# Patient Record
Sex: Female | Born: 1950 | Race: White | Hispanic: Yes | Marital: Married | Smoking: Never smoker
Health system: Southern US, Community
[De-identification: ages and names within clinical notes are randomized; demographics above are authoritative.]

## PROBLEM LIST (undated history)

## (undated) DIAGNOSIS — F329 Major depressive disorder, single episode, unspecified: Secondary | ICD-10-CM

## (undated) DIAGNOSIS — M8580 Other specified disorders of bone density and structure, unspecified site: Secondary | ICD-10-CM

## (undated) DIAGNOSIS — F32A Depression, unspecified: Secondary | ICD-10-CM

## (undated) DIAGNOSIS — M1990 Unspecified osteoarthritis, unspecified site: Secondary | ICD-10-CM

## (undated) DIAGNOSIS — C50919 Malignant neoplasm of unspecified site of unspecified female breast: Secondary | ICD-10-CM

## (undated) DIAGNOSIS — G4700 Insomnia, unspecified: Secondary | ICD-10-CM

## (undated) DIAGNOSIS — Z853 Personal history of malignant neoplasm of breast: Principal | ICD-10-CM

## (undated) DIAGNOSIS — Z1379 Encounter for other screening for genetic and chromosomal anomalies: Principal | ICD-10-CM

## (undated) DIAGNOSIS — E039 Hypothyroidism, unspecified: Secondary | ICD-10-CM

## (undated) DIAGNOSIS — F419 Anxiety disorder, unspecified: Secondary | ICD-10-CM

## (undated) HISTORY — PX: OOPHORECTOMY: SHX86

## (undated) HISTORY — PX: NOSE SURGERY: SHX723

## (undated) HISTORY — DX: Insomnia, unspecified: G47.00

## (undated) HISTORY — DX: Other specified disorders of bone density and structure, unspecified site: M85.80

## (undated) HISTORY — DX: Major depressive disorder, single episode, unspecified: F32.9

## (undated) HISTORY — DX: Encounter for other screening for genetic and chromosomal anomalies: Z13.79

## (undated) HISTORY — DX: Malignant neoplasm of unspecified site of unspecified female breast: C50.919

## (undated) HISTORY — DX: Depression, unspecified: F32.A

## (undated) HISTORY — PX: EXTERNAL EAR SURGERY: SHX627

## (undated) HISTORY — DX: Hypothyroidism, unspecified: E03.9

## (undated) HISTORY — DX: Unspecified osteoarthritis, unspecified site: M19.90

## (undated) HISTORY — DX: Personal history of malignant neoplasm of breast: Z85.3

## (undated) HISTORY — DX: Anxiety disorder, unspecified: F41.9

---

## 1994-09-06 DIAGNOSIS — C50919 Malignant neoplasm of unspecified site of unspecified female breast: Secondary | ICD-10-CM

## 1994-09-06 DIAGNOSIS — Z853 Personal history of malignant neoplasm of breast: Secondary | ICD-10-CM

## 1994-09-06 HISTORY — PX: MASTECTOMY: SHX3

## 1994-09-06 HISTORY — DX: Personal history of malignant neoplasm of breast: Z85.3

## 1994-09-06 HISTORY — PX: BREAST SURGERY: SHX581

## 1994-09-06 HISTORY — DX: Malignant neoplasm of unspecified site of unspecified female breast: C50.919

## 1998-02-12 ENCOUNTER — Other Ambulatory Visit: Admission: RE | Admit: 1998-02-12 | Discharge: 1998-02-12 | Payer: Self-pay | Admitting: Gynecology

## 1998-02-19 ENCOUNTER — Other Ambulatory Visit: Admission: RE | Admit: 1998-02-19 | Discharge: 1998-02-19 | Payer: Self-pay | Admitting: Gynecology

## 1998-04-22 ENCOUNTER — Ambulatory Visit (HOSPITAL_COMMUNITY): Admission: RE | Admit: 1998-04-22 | Discharge: 1998-04-22 | Payer: Self-pay | Admitting: Gastroenterology

## 1998-11-11 ENCOUNTER — Other Ambulatory Visit: Admission: RE | Admit: 1998-11-11 | Discharge: 1998-11-11 | Payer: Self-pay | Admitting: Gynecology

## 1999-02-17 ENCOUNTER — Other Ambulatory Visit: Admission: RE | Admit: 1999-02-17 | Discharge: 1999-02-17 | Payer: Self-pay | Admitting: Gynecology

## 1999-10-26 ENCOUNTER — Encounter: Admission: RE | Admit: 1999-10-26 | Discharge: 1999-10-26 | Payer: Self-pay | Admitting: Oncology

## 1999-10-26 ENCOUNTER — Encounter: Payer: Self-pay | Admitting: Oncology

## 2000-04-18 ENCOUNTER — Other Ambulatory Visit: Admission: RE | Admit: 2000-04-18 | Discharge: 2000-04-18 | Payer: Self-pay | Admitting: Internal Medicine

## 2000-05-11 ENCOUNTER — Encounter: Payer: Self-pay | Admitting: Oncology

## 2000-05-11 ENCOUNTER — Encounter: Admission: RE | Admit: 2000-05-11 | Discharge: 2000-05-11 | Payer: Self-pay | Admitting: Oncology

## 2000-05-12 ENCOUNTER — Encounter: Payer: Self-pay | Admitting: Oncology

## 2000-05-12 ENCOUNTER — Encounter: Admission: RE | Admit: 2000-05-12 | Discharge: 2000-05-12 | Payer: Self-pay | Admitting: Oncology

## 2001-04-20 ENCOUNTER — Other Ambulatory Visit: Admission: RE | Admit: 2001-04-20 | Discharge: 2001-04-20 | Payer: Self-pay | Admitting: Gynecology

## 2001-05-15 ENCOUNTER — Encounter: Payer: Self-pay | Admitting: Oncology

## 2001-05-15 ENCOUNTER — Encounter: Admission: RE | Admit: 2001-05-15 | Discharge: 2001-05-15 | Payer: Self-pay | Admitting: Oncology

## 2002-04-20 ENCOUNTER — Other Ambulatory Visit: Admission: RE | Admit: 2002-04-20 | Discharge: 2002-04-20 | Payer: Self-pay | Admitting: Gynecology

## 2002-05-09 ENCOUNTER — Encounter: Admission: RE | Admit: 2002-05-09 | Discharge: 2002-05-09 | Payer: Self-pay | Admitting: Gynecology

## 2002-05-09 ENCOUNTER — Encounter: Payer: Self-pay | Admitting: Gynecology

## 2002-05-16 ENCOUNTER — Encounter: Admission: RE | Admit: 2002-05-16 | Discharge: 2002-05-16 | Payer: Self-pay | Admitting: Oncology

## 2002-05-16 ENCOUNTER — Encounter: Payer: Self-pay | Admitting: Oncology

## 2003-02-13 ENCOUNTER — Ambulatory Visit (HOSPITAL_BASED_OUTPATIENT_CLINIC_OR_DEPARTMENT_OTHER): Admission: RE | Admit: 2003-02-13 | Discharge: 2003-02-13 | Payer: Self-pay | Admitting: Orthopedic Surgery

## 2003-05-02 ENCOUNTER — Other Ambulatory Visit: Admission: RE | Admit: 2003-05-02 | Discharge: 2003-05-02 | Payer: Self-pay | Admitting: Gynecology

## 2003-05-03 ENCOUNTER — Encounter: Admission: RE | Admit: 2003-05-03 | Discharge: 2003-05-03 | Payer: Self-pay | Admitting: Gynecology

## 2003-05-03 ENCOUNTER — Encounter: Payer: Self-pay | Admitting: Gynecology

## 2003-05-27 ENCOUNTER — Encounter: Payer: Self-pay | Admitting: Gynecology

## 2003-05-27 ENCOUNTER — Ambulatory Visit (HOSPITAL_COMMUNITY): Admission: RE | Admit: 2003-05-27 | Discharge: 2003-05-27 | Payer: Self-pay | Admitting: Gynecology

## 2004-02-12 ENCOUNTER — Encounter: Admission: RE | Admit: 2004-02-12 | Discharge: 2004-02-12 | Payer: Self-pay

## 2004-05-15 ENCOUNTER — Other Ambulatory Visit: Admission: RE | Admit: 2004-05-15 | Discharge: 2004-05-15 | Payer: Self-pay | Admitting: Gynecology

## 2005-01-13 ENCOUNTER — Ambulatory Visit: Payer: Self-pay | Admitting: Internal Medicine

## 2005-01-18 ENCOUNTER — Ambulatory Visit: Payer: Self-pay | Admitting: Internal Medicine

## 2005-02-12 ENCOUNTER — Encounter: Admission: RE | Admit: 2005-02-12 | Discharge: 2005-02-12 | Payer: Self-pay | Admitting: Gynecology

## 2005-04-29 ENCOUNTER — Encounter: Admission: RE | Admit: 2005-04-29 | Discharge: 2005-04-29 | Payer: Self-pay | Admitting: Gynecology

## 2005-05-20 ENCOUNTER — Other Ambulatory Visit: Admission: RE | Admit: 2005-05-20 | Discharge: 2005-05-20 | Payer: Self-pay | Admitting: Gynecology

## 2005-06-10 ENCOUNTER — Ambulatory Visit: Payer: Self-pay | Admitting: Oncology

## 2005-07-07 ENCOUNTER — Ambulatory Visit: Payer: Self-pay | Admitting: Internal Medicine

## 2005-07-12 ENCOUNTER — Ambulatory Visit: Payer: Self-pay | Admitting: Internal Medicine

## 2005-07-19 ENCOUNTER — Emergency Department (HOSPITAL_COMMUNITY): Admission: EM | Admit: 2005-07-19 | Discharge: 2005-07-19 | Payer: Self-pay | Admitting: *Deleted

## 2005-11-09 ENCOUNTER — Other Ambulatory Visit: Admission: RE | Admit: 2005-11-09 | Discharge: 2005-11-09 | Payer: Self-pay | Admitting: Gynecology

## 2005-12-28 ENCOUNTER — Encounter: Admission: RE | Admit: 2005-12-28 | Discharge: 2005-12-28 | Payer: Self-pay | Admitting: Family Medicine

## 2006-01-03 ENCOUNTER — Encounter: Admission: RE | Admit: 2006-01-03 | Discharge: 2006-01-03 | Payer: Self-pay | Admitting: Family Medicine

## 2006-03-07 ENCOUNTER — Ambulatory Visit: Payer: Self-pay | Admitting: Internal Medicine

## 2006-03-10 ENCOUNTER — Ambulatory Visit: Payer: Self-pay | Admitting: Gastroenterology

## 2006-03-28 ENCOUNTER — Encounter: Admission: RE | Admit: 2006-03-28 | Discharge: 2006-03-28 | Payer: Self-pay | Admitting: Gynecology

## 2006-04-22 ENCOUNTER — Ambulatory Visit: Payer: Self-pay | Admitting: Family Medicine

## 2006-04-25 ENCOUNTER — Encounter: Admission: RE | Admit: 2006-04-25 | Discharge: 2006-04-25 | Payer: Self-pay | Admitting: Internal Medicine

## 2006-04-27 ENCOUNTER — Ambulatory Visit: Payer: Self-pay | Admitting: Family Medicine

## 2006-04-28 ENCOUNTER — Encounter: Admission: RE | Admit: 2006-04-28 | Discharge: 2006-04-28 | Payer: Self-pay | Admitting: Internal Medicine

## 2006-05-06 ENCOUNTER — Ambulatory Visit: Payer: Self-pay | Admitting: Family Medicine

## 2006-05-24 ENCOUNTER — Other Ambulatory Visit: Admission: RE | Admit: 2006-05-24 | Discharge: 2006-05-24 | Payer: Self-pay | Admitting: Gynecology

## 2006-09-02 ENCOUNTER — Ambulatory Visit: Payer: Self-pay | Admitting: Internal Medicine

## 2006-11-11 ENCOUNTER — Ambulatory Visit: Payer: Self-pay | Admitting: Family Medicine

## 2006-11-24 ENCOUNTER — Encounter (INDEPENDENT_AMBULATORY_CARE_PROVIDER_SITE_OTHER): Payer: Self-pay | Admitting: Specialist

## 2006-11-24 ENCOUNTER — Ambulatory Visit (HOSPITAL_BASED_OUTPATIENT_CLINIC_OR_DEPARTMENT_OTHER): Admission: RE | Admit: 2006-11-24 | Discharge: 2006-11-24 | Payer: Self-pay | Admitting: Gynecology

## 2006-12-15 ENCOUNTER — Ambulatory Visit: Payer: Self-pay | Admitting: Oncology

## 2006-12-29 ENCOUNTER — Ambulatory Visit (HOSPITAL_COMMUNITY): Admission: RE | Admit: 2006-12-29 | Discharge: 2006-12-29 | Payer: Self-pay | Admitting: Oncology

## 2007-02-20 ENCOUNTER — Ambulatory Visit: Payer: Self-pay | Admitting: Oncology

## 2007-08-18 IMAGING — CT CT CHEST W/ CM
2 of 3 series · 15 of 36 positions shown, 18 images · IV contrast (omnipaque)
Comparison: None

CLINICAL DATA: Breast cancer left axillary and chest wall pain

CHEST CT WITH CONTRAST
TECHNIQUE: Multidetector CT imaging of the chest was performed following the
standard protocol during bolus administration of intravenous contrast.
Contrast:  80 cc Omnipaque 300

[Series 2: chest routine 5.0 b40f · axial · 0.63mm/px · z∈[-256,+34]mm · 12 of 68 slices shown, 15 images]
[im 5/68  mediastinal]
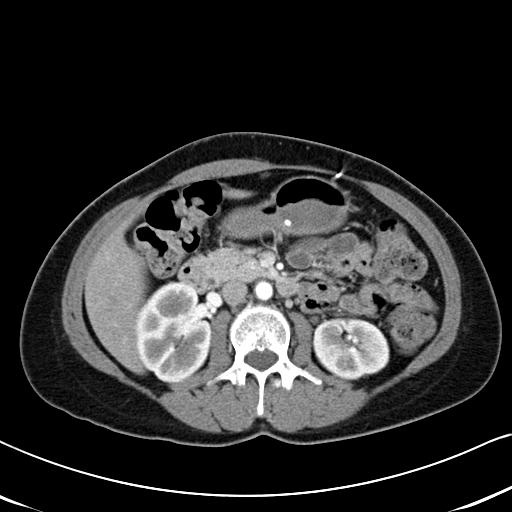
[im 5/68  lung]
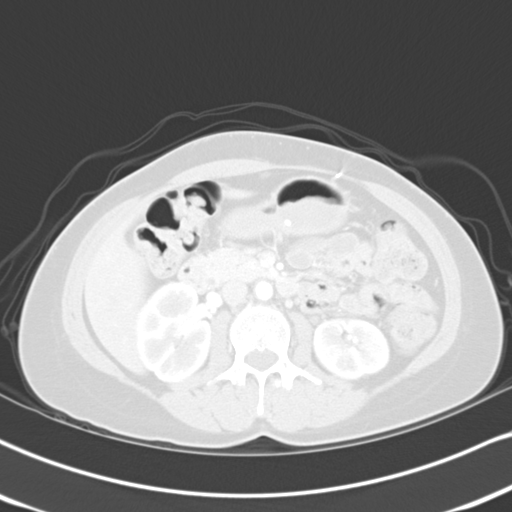
[im 10/68  lung]
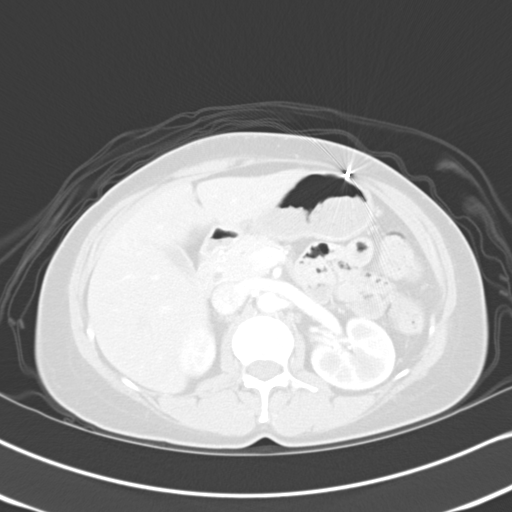
[im 15/68  lung]
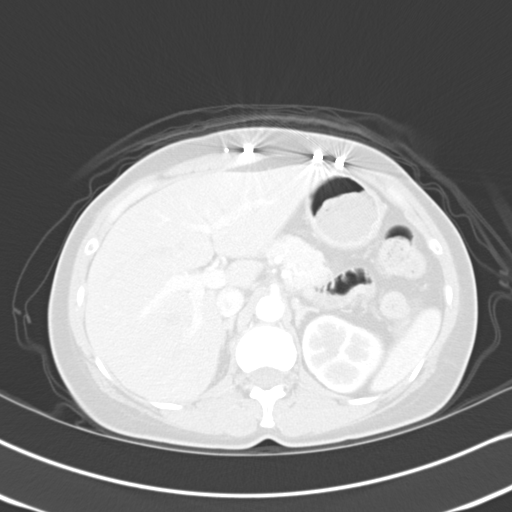
[im 20/68  lung]
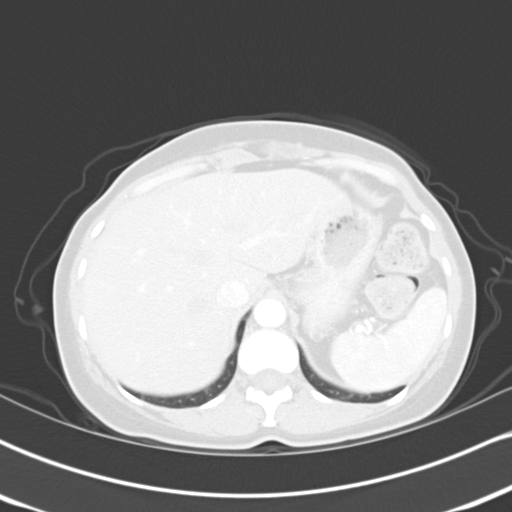
[im 25/68  mediastinal]
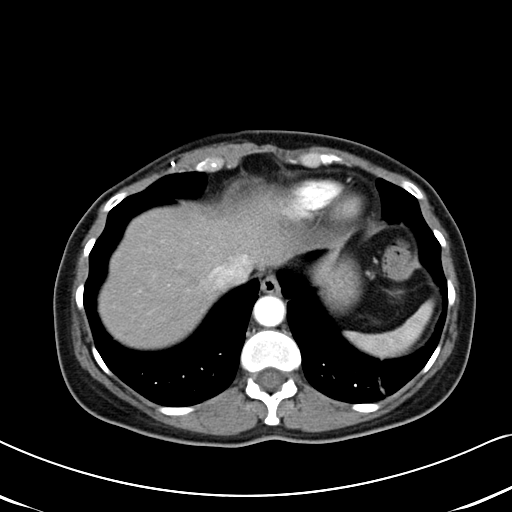
[im 25/68  lung]
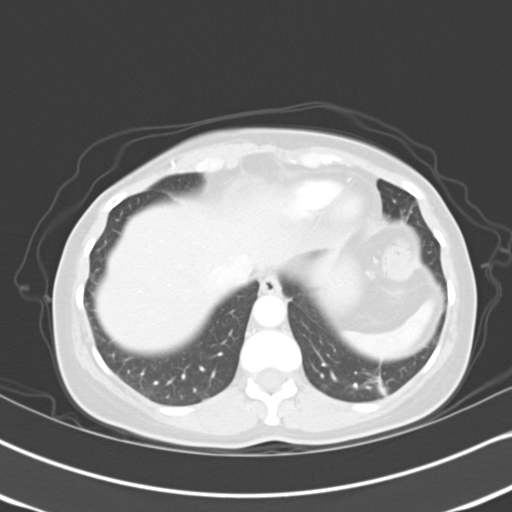
[im 30/68  lung]
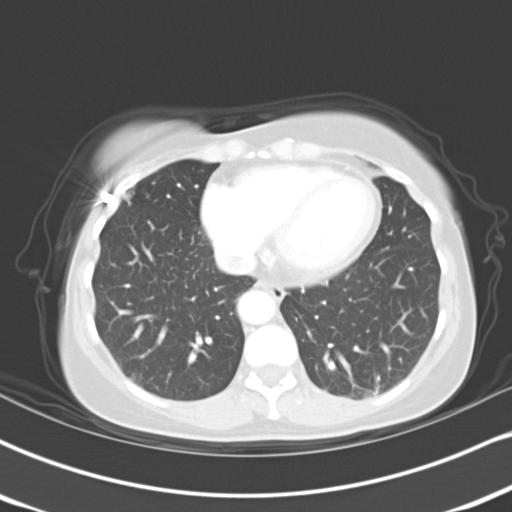
[im 38/68  lung]
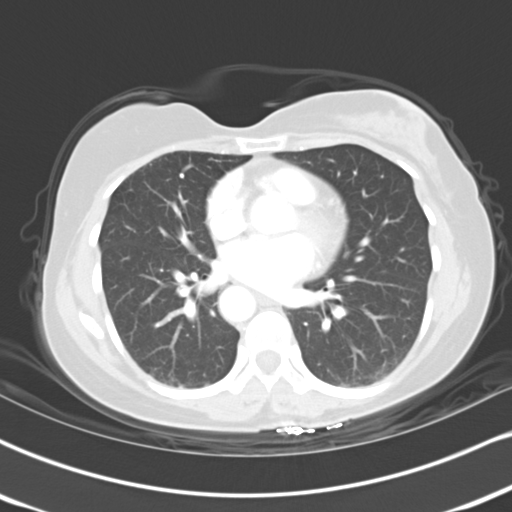
[im 43/68  lung]
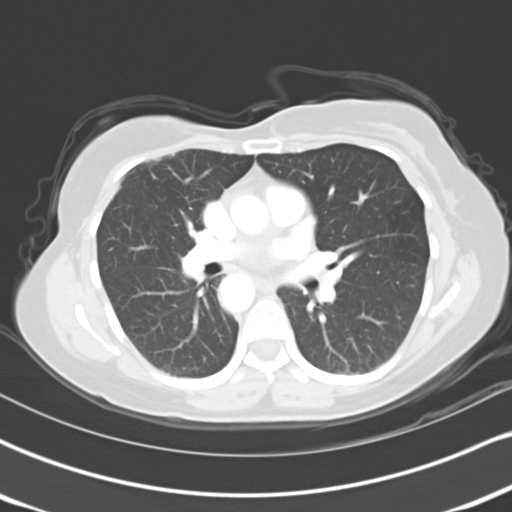
[im 48/68  mediastinal]
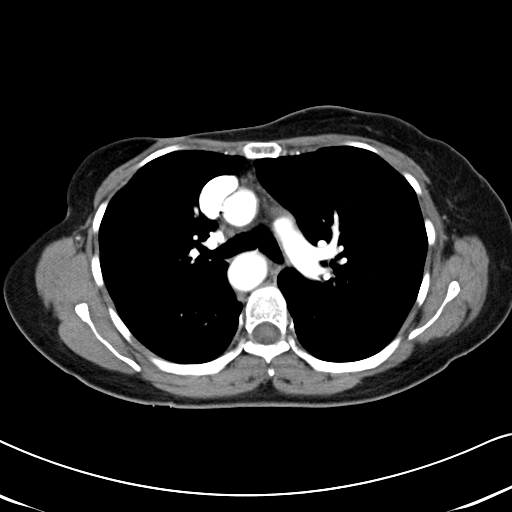
[im 48/68  lung]
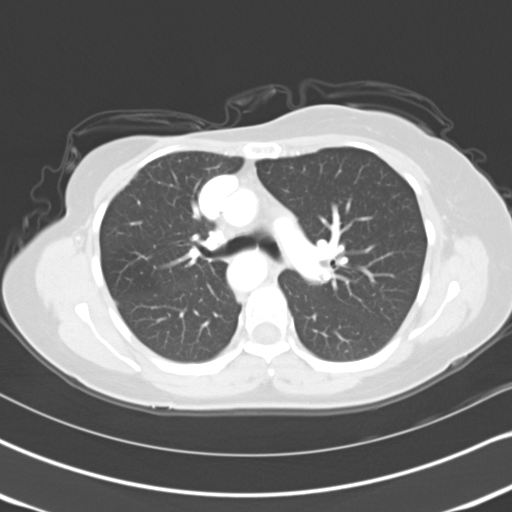
[im 53/68  lung]
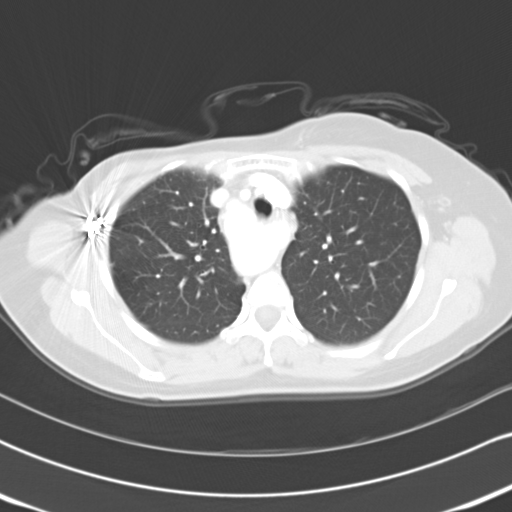
[im 58/68  lung]
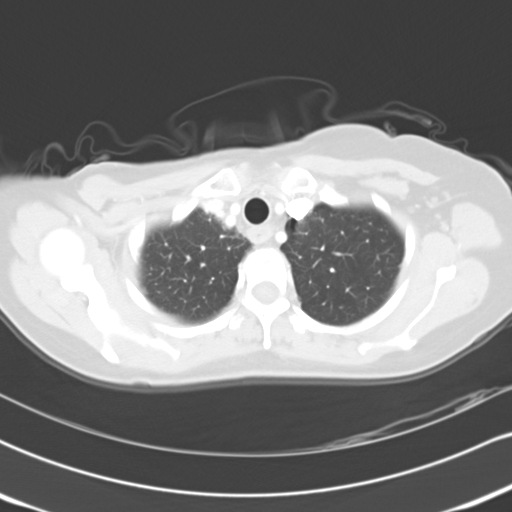
[im 63/68  lung]
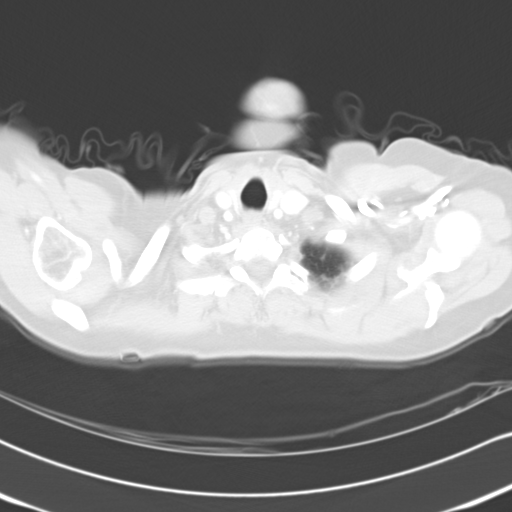

[Series 602: <mpr range> · coronal · 0.66mm/px · 3 of 95 slices shown]
[im 19/95  lung]
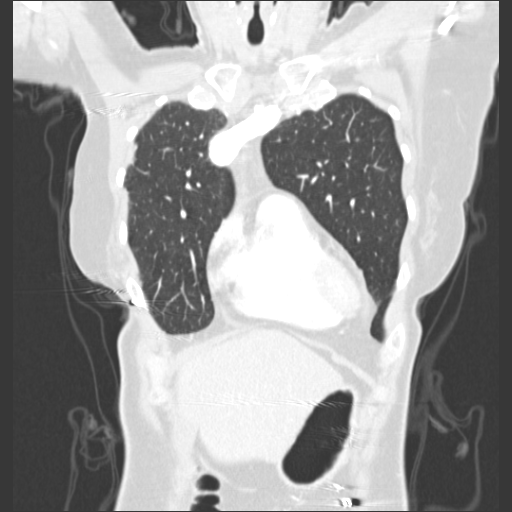
[im 38/95  lung]
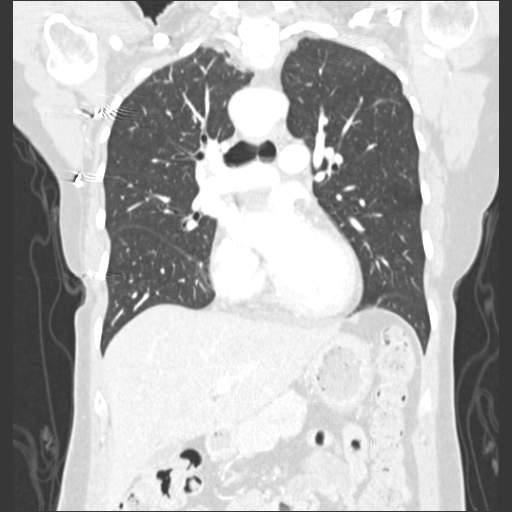
[im 57/95  lung]
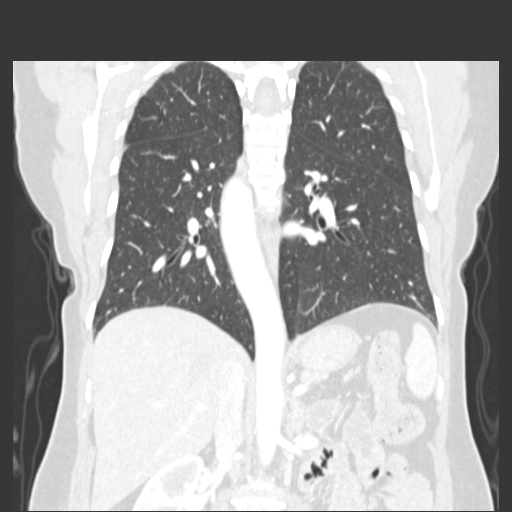

[15 of 36 positions shown; findings below may reference images not displayed]

FINDINGS: Patient is status post right breast surgery and right axillary node
dissection. No mediastinal, hilar, or axillary adenopathy. Specifically, no left
axillary adenopathy. Small left axillary lymph nodes are seen, none of which are
pathologically enlarged. Probable radiation changes within the anterior right
lung. Linear density seen within the left lung base compatible with scarring or
subsegmental atelectasis. Small calcified nodule seen in the right lower lobe
posteriorly on image 40, most compatible with calcified granuloma. No pleural
effusion or pericardial effusion.

There is a right-sided aortic arch with retroesophageal left subclavian artery.
Thyroid and chest wall soft tissues are unremarkable. No significant abnormality
seen in the upper abdomen.

IMPRESSION

Postoperative changes seen in the right breast and right axilla. Postradiation
changes in the right anterior lung. No evidence of metastatic disease.

Right aortic arch with retroesophageal left subclavian artery.

Scarring or atelectasis in the posterior left lung base.

## 2008-01-19 ENCOUNTER — Other Ambulatory Visit: Admission: RE | Admit: 2008-01-19 | Discharge: 2008-01-19 | Payer: Self-pay | Admitting: Gynecology

## 2008-05-24 ENCOUNTER — Encounter: Admission: RE | Admit: 2008-05-24 | Discharge: 2008-05-24 | Payer: Self-pay | Admitting: Gynecology

## 2008-06-11 ENCOUNTER — Ambulatory Visit: Payer: Self-pay | Admitting: Internal Medicine

## 2008-06-11 ENCOUNTER — Telehealth: Payer: Self-pay | Admitting: Internal Medicine

## 2008-06-11 DIAGNOSIS — Z853 Personal history of malignant neoplasm of breast: Secondary | ICD-10-CM

## 2008-06-11 DIAGNOSIS — E039 Hypothyroidism, unspecified: Secondary | ICD-10-CM | POA: Insufficient documentation

## 2008-08-19 ENCOUNTER — Encounter: Admission: RE | Admit: 2008-08-19 | Discharge: 2008-08-19 | Payer: Self-pay | Admitting: Internal Medicine

## 2008-08-19 ENCOUNTER — Ambulatory Visit: Payer: Self-pay | Admitting: Internal Medicine

## 2008-08-19 DIAGNOSIS — K5900 Constipation, unspecified: Secondary | ICD-10-CM | POA: Insufficient documentation

## 2008-08-19 DIAGNOSIS — R1031 Right lower quadrant pain: Secondary | ICD-10-CM

## 2008-08-19 LAB — CONVERTED CEMR LAB
Bilirubin Urine: NEGATIVE
Glucose, Urine, Semiquant: NEGATIVE
Ketones, urine, test strip: NEGATIVE
Nitrite: NEGATIVE
Protein, U semiquant: NEGATIVE
Specific Gravity, Urine: 1.01
Urobilinogen, UA: 0.2
pH: 6.5

## 2008-08-20 ENCOUNTER — Ambulatory Visit: Payer: Self-pay | Admitting: Internal Medicine

## 2008-08-20 ENCOUNTER — Telehealth (INDEPENDENT_AMBULATORY_CARE_PROVIDER_SITE_OTHER): Payer: Self-pay | Admitting: *Deleted

## 2008-08-20 LAB — CONVERTED CEMR LAB

## 2008-08-23 ENCOUNTER — Telehealth (INDEPENDENT_AMBULATORY_CARE_PROVIDER_SITE_OTHER): Payer: Self-pay | Admitting: *Deleted

## 2008-08-23 LAB — CONVERTED CEMR LAB: TSH: 1.64 microintl units/mL (ref 0.35–5.50)

## 2008-08-27 ENCOUNTER — Telehealth (INDEPENDENT_AMBULATORY_CARE_PROVIDER_SITE_OTHER): Payer: Self-pay | Admitting: *Deleted

## 2008-09-03 ENCOUNTER — Ambulatory Visit: Payer: Self-pay | Admitting: Gynecology

## 2008-09-17 ENCOUNTER — Ambulatory Visit: Payer: Self-pay | Admitting: Gynecology

## 2008-10-01 ENCOUNTER — Ambulatory Visit: Payer: Self-pay | Admitting: Gynecology

## 2009-03-20 ENCOUNTER — Encounter: Payer: Self-pay | Admitting: Gynecology

## 2009-03-20 ENCOUNTER — Ambulatory Visit: Payer: Self-pay | Admitting: Gynecology

## 2009-03-20 ENCOUNTER — Other Ambulatory Visit: Admission: RE | Admit: 2009-03-20 | Discharge: 2009-03-20 | Payer: Self-pay | Admitting: Gynecology

## 2009-03-25 ENCOUNTER — Ambulatory Visit: Payer: Self-pay | Admitting: Gynecology

## 2009-05-28 ENCOUNTER — Encounter: Admission: RE | Admit: 2009-05-28 | Discharge: 2009-05-28 | Payer: Self-pay | Admitting: Oncology

## 2009-11-10 ENCOUNTER — Ambulatory Visit: Payer: Self-pay | Admitting: Oncology

## 2010-01-02 ENCOUNTER — Emergency Department (HOSPITAL_COMMUNITY): Admission: EM | Admit: 2010-01-02 | Discharge: 2010-01-03 | Payer: Self-pay

## 2010-01-27 ENCOUNTER — Ambulatory Visit: Payer: Self-pay | Admitting: Gynecology

## 2010-03-23 ENCOUNTER — Other Ambulatory Visit: Admission: RE | Admit: 2010-03-23 | Discharge: 2010-03-23 | Payer: Self-pay | Admitting: Gynecology

## 2010-03-23 ENCOUNTER — Ambulatory Visit: Payer: Self-pay | Admitting: Gynecology

## 2010-03-23 ENCOUNTER — Ambulatory Visit (HOSPITAL_COMMUNITY): Admission: RE | Admit: 2010-03-23 | Discharge: 2010-03-23 | Payer: Self-pay | Admitting: Gynecology

## 2010-03-25 ENCOUNTER — Ambulatory Visit: Payer: Self-pay | Admitting: Gynecology

## 2010-04-28 ENCOUNTER — Ambulatory Visit: Payer: Self-pay | Admitting: Gynecology

## 2010-05-29 ENCOUNTER — Encounter: Admission: RE | Admit: 2010-05-29 | Discharge: 2010-05-29 | Payer: Self-pay | Admitting: Gynecology

## 2010-09-27 ENCOUNTER — Encounter: Payer: Self-pay | Admitting: Gynecology

## 2010-09-27 ENCOUNTER — Encounter: Payer: Self-pay | Admitting: Internal Medicine

## 2010-11-05 ENCOUNTER — Other Ambulatory Visit: Payer: Self-pay | Admitting: Oncology

## 2010-11-05 ENCOUNTER — Encounter (HOSPITAL_BASED_OUTPATIENT_CLINIC_OR_DEPARTMENT_OTHER): Payer: PRIVATE HEALTH INSURANCE | Admitting: Oncology

## 2010-11-05 DIAGNOSIS — R079 Chest pain, unspecified: Secondary | ICD-10-CM

## 2010-11-05 DIAGNOSIS — C50919 Malignant neoplasm of unspecified site of unspecified female breast: Secondary | ICD-10-CM

## 2010-11-05 LAB — CBC WITH DIFFERENTIAL/PLATELET
BASO%: 0.8 % (ref 0.0–2.0)
Basophils Absolute: 0 10*3/uL (ref 0.0–0.1)
Eosinophils Absolute: 0.1 10*3/uL (ref 0.0–0.5)
HCT: 35.4 % (ref 34.8–46.6)
HGB: 12.1 g/dL (ref 11.6–15.9)
LYMPH%: 33.1 % (ref 14.0–49.7)
MCHC: 34.2 g/dL (ref 31.5–36.0)
MONO#: 0.5 10*3/uL (ref 0.1–0.9)
NEUT#: 3.3 10*3/uL (ref 1.5–6.5)
NEUT%: 55.5 % (ref 38.4–76.8)
Platelets: 224 10*3/uL (ref 145–400)
WBC: 6 10*3/uL (ref 3.9–10.3)
lymph#: 2 10*3/uL (ref 0.9–3.3)

## 2010-11-05 LAB — COMPREHENSIVE METABOLIC PANEL
ALT: 12 U/L (ref 0–35)
CO2: 26 mEq/L (ref 19–32)
Calcium: 9.7 mg/dL (ref 8.4–10.5)
Chloride: 100 mEq/L (ref 96–112)
Creatinine, Ser: 0.58 mg/dL (ref 0.40–1.20)
Glucose, Bld: 94 mg/dL (ref 70–99)
Total Protein: 7.4 g/dL (ref 6.0–8.3)

## 2010-11-12 ENCOUNTER — Encounter (HOSPITAL_BASED_OUTPATIENT_CLINIC_OR_DEPARTMENT_OTHER): Payer: PRIVATE HEALTH INSURANCE | Admitting: Oncology

## 2010-11-12 DIAGNOSIS — C50919 Malignant neoplasm of unspecified site of unspecified female breast: Secondary | ICD-10-CM

## 2010-11-12 DIAGNOSIS — R079 Chest pain, unspecified: Secondary | ICD-10-CM

## 2010-11-12 DIAGNOSIS — IMO0001 Reserved for inherently not codable concepts without codable children: Secondary | ICD-10-CM

## 2011-01-22 NOTE — Assessment & Plan Note (Signed)
[facility]                          [facility] OFFICE NOTE   NAME:LOYERaynald Kemp                          MRN:          [id number]  DATE:04/27/2006                            DOB:          [birth year]    REASON FOR VISIT:  Shaky, nervous.   Ms. Denise Edwards is a 60 year old female who has a history of anxiety and  depression.  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  She felt like she was being enclosed and lost  control.  She felt a sense of doom.  She describes herself as feeling  very, very nervous.  Of note, she takes alprazolam twice a day, 0.25 mg, but  this morning she missed her dose because she did not want to be too  sedated for her first day at school.  She did report a slight headache, but  attributed that to the tension that was going on at the time of her  symptoms.  She was brought in by 2 coworkers to our office.   MEDICATIONS:  1. Synthroid 50 mcg.  2. Evista 60 mg.  3. Diclofenac 75 mg.  4. Effexor XR 75 mg.  5. Alprazolam 0.25 mg b.i.d.   ALLERGIES:  No known drug allergies.   OBJECTIVE:  VITAL SIGNS:  Pulse of 76, blood pressure 120/72.  GENERAL:  We have a pleasant female who is anxious and shaking.  She was  also tearful.  HEENT:  Normocephalic, atraumatic.  Pupils were equal and reactive to light,  extraocular muscles were intact.  No nystagmus was appreciated.  LUNGS:  Clear.  HEART:  Regular rate and rhythm, normal S1, S2, no murmurs, gallops or rubs.  NEUROLOGIC:  Nonfocal.  MOOD:  The patient appears very anxious with a depressed mood.  Denies any  suicidal ideations.   IMPRESSION:  Panic attack.   PLAN:  1. The patient was monitored in the office for 20 minutes.  She improved      some while in the office.  My recommendation was to send the patient to      the Emergency Department, but she  preferred to go home and take 2 0.25      mg of alprazolam, and if her symptoms did not abate significantly      within an hour she agreed to be seen in the ER.  Her husband came to      pick her up, and was advised of my recommendation, and agreed to      monitor her an hour after taking the medicine.  If she did not improve,      he was going to take her to the Emergency Department.  2. The patient was also scheduled while here to see Arbutus Ped at noon      tomorrow.  She will also be scheduled to see her psychiatrist, Dr.      Dub Mikes, who she has not seen in many years.  3. The patient expressed understanding, and agreed to follow my directions      if her symptoms do not improve an hour after taking the alprazolam.      She is also advised to take it twice a day as previously prescribed.                                   Leanne Chang, MD   LA/MedQ  DD:  04/27/2006  DT:  04/28/2006  Job #:  [id number]

## 2011-01-22 NOTE — H&P (Signed)
Denise Edwards, Denise Edwards                   ACCOUNT NO.:  [id number]   MEDICAL RECORD NO.:  [id number]          PATIENT TYPE:  AMB   LOCATION:  [facility]                         FACILITY:  [facility]   PHYSICIAN:  Juan H. Lily Peer, M.D.DATE OF BIRTH:  1951/09/05   DATE OF ADMISSION:  11/24/2006  DATE OF DISCHARGE:                              HISTORY & PHYSICAL   Patient scheduled for surgery Thursday, March 20, at 7:30 a.m. at [facility].  Please have history and physical available.   CHIEF COMPLAINT:  Personal and family history of breast cancer.   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  She has 5 out of 14 lymph nodes being positive.  The patient  has had chemotherapy, which was completed 5 years ago, and had been on  tamoxifen for 6 years as well.  She is currently on Evista 60 mg daily.  Patient with a strong family history of breast cancer.  Her mother was  diagnosed and died at the age of 80 with breast cancer, her sister at 5  with breast cancer, and recently diagnosed a second sister with breast  cancer.  The patient is in the process of having BRCA-1 and BRCA-2  testing with the medical oncologist, Dr. Darnelle Catalan.  Patient with prior  history of left salpingo-oophorectomy for hemorrhagic ovarian cyst.  The  patient is being scheduled to undergo a prophylactic right salpingo-  oophorectomy.   PAST MEDICAL HISTORY:  The patient denies any allergies.   She has had one prior cesarean section, one laparoscopic salpingo-  oophorectomy.  She has had modified radical mastectomy of the right  breast with flap reconstruction in 1996.  She has had left ear surgery  as the result of an accident and a fractured nose.  She has history of  hypothyroidism, for which she is on Synthroid 50 mcg daily.  She also  has  osteopenia.   She is on calcium with vitamin D.  She is also on Evista 60 mg daily, is  on Xanax 0.5 mg p.r.n. anxiety, and she takes Lunesta 2 mg p.o. h.s.  p.r.n. insomnia.   Other medical history:  As mentioned before, a history of right  infiltrating ductal carcinoma of the breast.   FAMILY HISTORY:  Mother and two sisters and the patient with a history  of breast cancer.   PHYSICAL EXAMINATION:  VITAL SIGNS:  The patient weighs 101 pounds.  Blood pressure 110/60.  HEENT:  Unremarkable.  NECK:  Supple.  No carotid bruits.  No thyromegaly.  LUNGS:  Clear to auscultation without rhonchi or wheezes.  HEART:  Regular rate and rhythm, no murmurs or gallops.  BREASTS:  Breast exam demonstrated right breast reconstructive surgery.  ABDOMEN:  Periumbilical scar noted as well as a long transverse incision  in the lower abdomen from her flap.  She has two areas that feel like  two clips  underneath which had been causing her discomfort, which will  be removed as well, possibly could be just Prolene from her previous  surgery.  PELVIC:  Bartholin's, urethra and Skene glands within normal limits.  Vagina and cervix:  No lesion or discharge.  Uterus anteverted, normal  size, shape and consistency.  Adnexa without any palpable mass or  tenderness.  RECTAL:  Deferred.   ASSESSMENT:  A 60 year old gravida 2, para 1, AB 1, with a history of  right breast infiltrating ductal carcinoma with 5 out of 14 nodes being  positive.  The patient had a modified radical mastectomy and flap  reconstruction of the right breast.  The patient had had chemotherapy  which was completed 5 years ago and then had been placed on tamoxifen  for 6 years as well.  She is currently on Evista 60 mg daily.  She is  being taken to the operating room for a prophylactic right salpingo-  oophorectomy secondary to strong family history and personal history of  breast cancer.  The patient will also have a separate small  incision in  her lower Pfannenstiel scar to remove these two tender spots that appear  to be either a piece of Prolene suture or a clip.  I spoke with Dr.  Josiah Lobo partner.  He looked at Dr. Josiah Lobo note since he was  not present and they did not use any staple.  This is probably Prolene,  so aside from the laparoscopic procedure we will make a small incision  and remove these two pieces of Prolene underneath the skin that are  causing her discomfort.  The risks and benefits and pros and cons of the  operation were discussed including infection, although she will receive  prophylactic antibiotic, the risk of trauma to the internal organs  requiring open laparotomy and corrective surgery at that time of if  there is any difficulty in entering the abdominal cavity or proceeding  with the operation laparoscopically, we will proceed then at that point  with removal of that ovary with an open laparotomy technique.  Also she  will have PSA stockings for DVT prophylaxis.  In the event of  uncontrolled hemorrhage and she would need blood, she is fully aware of  the potential risk of anaphylactic reaction, hepatitis and AIDS.  All  these questions were discussed in detail in Spanish and all questions  were answered, and we will follow accordingly.   PLAN:  The patient is scheduled for a laparoscopic and right salpingo-  oophorectomy and removal of two Prolene sutures underneath her  Pfannenstiel scar on Thursday, March 20, at 7:30 a.m. at the [facility].  Please have history and physical available.      Juan H. Lily Peer, M.D.  Electronically Signed     JHF/MEDQ  D:  11/23/2006  T:  11/23/2006  Job:  [id number]

## 2011-01-22 NOTE — Op Note (Signed)
Denise Edwards, PARCHMENT                   ACCOUNT NO.:  [id number]   MEDICAL RECORD NO.:  [id number]          PATIENT TYPE:  AMB   LOCATION:  [facility]                         [facility]ACILITY:  [facility]   PHYSICIAN:  Juan H. Lily Peer, M.D.DATE OF BIRTH:  11-Sep-1950   DATE OF PROCEDURE:  11/24/2006  DATE OF DISCHARGE:                               OPERATIVE REPORT   SURGEON:  Juan H. Lily Peer, M.D.   FIRST ASSISTANT:  Edyth Gunnels, MD   INDICATIONS FOR OPERATION:  60 year old gravida 2, para 1, AB 1 who was  taken to the operating room to remove the remaining right tube and ovary  due to the fact that she has a personal history of infiltrating  intraductal carcinoma and 1996 and with two sisters and a mother with  breast cancer.   PREOPERATIVE DIAGNOSIS:  1. Personal and family history of breast cancer.   POSTOPERATIVE DIAGNOSIS:  1. Personal and family history of breast cancer.   PROCEDURE PERFORMED:  1. Prophylactic laparoscopic right salpingo-oophorectomy.  2. Removal of abdominal foreign body/Prolene suture from prior      surgery.   DESCRIPTION OF OPERATION:  After the patient adequately counseled, she  was taken to the operating room where she underwent general endotracheal  anesthesia.  The patient had PSA stockings for DVT prophylaxis.  Foley  catheter had been inserted to monitor urinary output.  Pelvic  examination demonstrated anteverted uterus, normal size, shape,  consistency with no palpable adnexal masses; Cohen's cannula was placed  in the cervix for manipulation of the uterus during laparoscopic  procedure and due to the fact that she had a small uterus and this was  done in effort to prevent uterine perforation.  So a Hulka tenaculum was  not placed.  After the abdomen had been prepped and draped, a small  semilunar incision was made adjacent to previous semilunar incision.  The incision was carried down to the rectus fascia.  The OptiVu trocar  was used to view entrance  into the peritoneal cavity which required  meticulous dissection bluntly with a scalpel marking the fascia to make  a small incision to allow the trocar to be inserted safely.  Once this  was accomplished, pneumoperitoneum was established for approximately 3  liters of carbon dioxide the patient was placed in Trendelenburg  position and two additional port sites were made five fingerbreadths  from the midline under laparoscopic guidance to allow the 5 mm trocars  to be inserted.  The bowel was removed from the pelvis to allow the  exposure of the right adnexa.  There was an adhesive band attached to  the right tube and ovary which was cauterized and transected.  The right  infundibulopelvic ligament was identified.  The contralateral adnexa  with some adhesions but the absence of the ovary from previous left  salpingo-oophorectomy.  The right infundibulopelvic ligament was  identified, was cauterized and transected as was the utero-ovarian  ligament and proximal right fallopian tube and the remaining peritoneum  was excised with the tube and ovary.  The right tube and ovary was  removed  through the 10 mm port site with a Endopouch and passed off the  operative field.  The 10 mm and 5 mm trocar sheaths were removed and  closure was started but prior to the closure, the two areas where the  patient's TRAM  flap had been done by the plastic surgeon, the patient  had been complaining of tenderness in that area and a small vertical  incision was made adjacent to the previous scar and with meticulous  dissection, two Prolene knots were identified and excised and removed.  The subcutaneous bleeders were Bovie cauterized.  The fascia was intact  and the skin was reapproximated with interrupted sutures of 3-0 Vicryl  suture.  The subumbilical fascia was closed with a running stitch of 3-0  Vicryl suture.  The subcutaneous bleeders were Bovie cauterized.  The  umbilical skin and two 5-mm trocar  sites were closed approximated with  Dermabond glue.  0.25% Marcaine for total 10 mL was infiltrated in all  three port sites for postoperative analgesia.  The Cohen's cannula was  then removed and the patient was extubated, transferred to recovery with  stable vital signs.  Blood loss was minimal and fluid resuscitation  consisted of 1000 mL of lactated Ringer's.  The patient be given Toradol  30 mg en route to the recovery room.  She was extubated, transferred to  recovery with stable vital signs.      Juan H. Lily Peer, M.D.  Electronically Signed     JHF/MEDQ  D:  11/24/2006  T:  11/24/2006  Job:  [id number]

## 2011-01-22 NOTE — Assessment & Plan Note (Signed)
[facility]                          [facility] OFFICE NOTE   NAME:Denise Edwards                          MRN:          [id number]  DATE:04/22/2006                            DOB:          Jan 14, 1951    REASON FOR VISIT:  Headache.   HISTORY OF PRESENT ILLNESS:  Denise Edwards is a 60 year old female who reports  that for the last several months, i.e. four months, she has been having  sudden left-sided temporal sharp pain that would last only a few seconds.  Occasionally she has noticed that she has had difficulty keeping her balance  over that same time period.  She denies any head trauma but does have a  history of breast cancer.  She reveals that she has been under a significant  amount of stress over the last several years.  She is having significant  marital discord and is away from her husband and is very frustrated with the  quality of her life due to these external stressors.   She has noticed some hair loss over the same time period.  She has been told  in the past by other physicians that it is due to her stress.  She has had  multiple evaluations, but nothing medically has been found.   PAST MEDICAL HISTORY:  1. Hypothyroidism.  2. Anxiety disorder.  3. Depression.  4. Osteoporosis.  5. Breast cancer.   PAST SURGICAL HISTORY:  1. Fracture of the left elbow in November of 2006.  2. Breast cancer, status post mastectomy.  3. Right ovarian resection secondary to pain.  4. Nasal surgery.   FAMILY HISTORY:  Mother passed away secondary to breast cancer.  Father  passed away of unknown cause.  She has one brother who is alive and well.  She has three sisters, one with breast cancer.   SOCIAL HISTORY:  She is married with one child.  Denies any alcohol or  tobacco use.   REVIEW OF SYSTEMS:  As per HPI.  Additionally, she denied any weakness,  blurry vision, paresthesias, chest pain, shortness of breath, or dyspnea on  exertion.   She also denied any syncope or presyncopal episodes.   OBJECTIVE:  VITAL SIGNS:  Weight 101, pulse 80, blood pressure 112/66.  GENERAL:  We have a pleasant female who became very tearful during our  discussion about her stressors.  It became very apparent that this was  causing significant stress in her life.  HEENT:  Normocephalic, atraumatic.  Pupils are equal, round, and reactive to  light.  Extraocular muscles were intact.  Tympanic membranes were both clear  bilaterally.  NECK:  Supple.  No lymphadenopathy, carotid bruits, or JVD.  LUNGS:  Clear.  HEART:  Regular rate and rhythm with normal S1 and S2.  No murmurs, rubs, or  gallops.  EXTREMITIES:  No clubbing, cyanosis, or edema.  NEUROLOGIC:  Cranial nerves II-XII grossly intact.  No focal sensory or  motor deficits were noted.  Deep tendon reflexes were 2+ and equal  bilaterally.  No pronator drift appreciated.  Cerebellar function was within  normal limits.   IMPRESSION:  8. A 60 year old female complaining of sudden recurrent left-sided      temporal headache associated with ataxia, although not apparent in the      office today.  2. Significant major depression with no history of suicidal ideation or      attempts.  3. Hair loss.   PLAN:  1. The 35-minute visit was used more for counseling and discussion      regarding her stressors.  I advised her to follow up with Fenton Malling,      and I will make the appointment for her.  2. In regards to her headache, it may be stress related, but nevertheless,      given the history of breast cancer, I feel that an MRI is warranted.  3. Alopecia.  This is stress related.  More localized to the frontal      hairline which appears more as thinning as opposed to complete hair      loss.  I will obtain a TSH to make sure she is at the appropriate level      of Synthroid and will refer to a dermatologist after review of those      findings if she agrees with that.  4. Further  recommendations after the above evaluation.                                   Leanne Chang, MD   LA/MedQ  DD:  04/25/2006  DT:  04/26/2006  Job #:  [id number]

## 2011-01-22 NOTE — Consult Note (Signed)
NAMEMARIKO, [name]:  [id number]   MEDICAL RECORD NO.:  [id number]          PATIENT TYPE:  EMS   LOCATION:  ED                           FACILITY:  [facility]   PHYSICIAN:  Artist Pais. Mina Marble, M.D.DATE OF BIRTH:  Oct 14, 1950   DATE OF CONSULTATION:  07/19/2005  DATE OF DISCHARGE:                                   CONSULTATION   REQUESTING PHYSICIAN:  Dr. Daphane Shepherd.   REASON FOR CONSULTATION:  Ms. Denise Edwards is a very pleasant 60 year old right-  hand dominant female who fell earlier today.  She presents today with  multiple extremity complaints, including left elbow pain, bilateral knee  pain.  No loss of consciousness.  She is an otherwise healthy 60 year old  female with no known drug allergies.  She is on medications but does not  have them with her.  She has a history of breast cancer in the past.  She  has been treated.  By history, has a remission.   PHYSICAL EXAMINATION:  GENERAL:  She presents today in the emergency  department, a well-developed and well-nourished female.  Alert and oriented  x3.  No loss of consciousness.  EXTREMITIES:  She has pain and swelling of the left upper extremity.  She  cannot actively extend the elbow in that area.  Again, she has pain and  swelling in the area of the olecranon process.  She is neurovascularly  intact grossly.  Median, radial, and ulnar function are intact.  She can  move her digits and wrists, but again, elbow function is decreased secondary  to pain and swelling.  She has some bilateral knee abrasions and contusions.  No other extremity injuries noted.   X-rays showed displaced olecranon fracture.   IMPRESSION:  A 60 year old female with a displaced olecranon fracture,  nondominant left elbow.   RECOMMENDATIONS:  At this point in time, she is placed in a well-padded  posterior long arm splint.  She was given Percocet for pain.  She will see  me in my office tomorrow.  We will schedule elective ORIF of the  left  olecranon in the next 7-10 days.      Artist Pais Mina Marble, M.D.  Electronically Signed     MAW/MEDQ  D:  07/19/2005  T:  07/19/2005  Job:  [id number]

## 2011-01-22 NOTE — Op Note (Signed)
   Denise Edwards, METTLER                             ACCOUNT NO.:  [id number]   MEDICAL RECORD NO.:  [id number]                   PATIENT TYPE:  AMB   LOCATION:  [facility]                                  FACILITY:  [facility]   PHYSICIAN:  Cindee Salt, M.D.                    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   PROCEDURE:  Release A1 pulley right ring finger.   SURGEON:  Cindee Salt, M.D.   ANESTHESIA:  Forearm based IV regional.   HISTORY:  The patient is a 60 year old female with a history of triggering  of her right ring finger which has not responded to conservative treatment.   DESCRIPTION OF PROCEDURE:  The patient was brought to the operating room.  A  forearm based IV regional anesthetic carried out without difficulty.  She  was prepped and draped using duraprep in a supine position, right arm free.  An oblique incision was made over the A1 pulley, carried down through  subcutaneous tissue.  A markedly tight A1 pulley found to be markedly  thickened was present.  No cyst was apparent.  The A1 pulley was released  after the protection of the neurovascular bundles.   The radial side was incised.  A small incision was made centrally in the A2  pulley.  A deformity of the flexor tendon was immediately apparent.  A large  accumulation of fluid proximal to the area of constriction in the flexor  sheath was present and no cyst was apparent.  The wound was copiously  irrigated with saline.  The skin was closed with interrupted 5-0 nylon  sutures.  A sterile compressive dressing was applied.  The patient tolerated  the procedure well and was taken to the recovery room for observation in  satisfactory condition.  She is discharged  home to return to the [employer] of [place] in 1 week; on Vicodin and Keflex.                                               Cindee Salt, M.D.    Angelique Blonder  D:  02/13/2003  T:  02/13/2003  Job:  [id number]

## 2011-03-29 ENCOUNTER — Encounter: Payer: Self-pay | Admitting: Anesthesiology

## 2011-04-01 ENCOUNTER — Encounter: Payer: Self-pay | Admitting: Gynecology

## 2011-04-01 ENCOUNTER — Other Ambulatory Visit (HOSPITAL_COMMUNITY)
Admission: RE | Admit: 2011-04-01 | Discharge: 2011-04-01 | Disposition: A | Discharge status: Home / Self Care | Payer: BC Managed Care – PPO | Source: Ambulatory Visit | Attending: Gynecology | Admitting: Gynecology

## 2011-04-01 ENCOUNTER — Ambulatory Visit (INDEPENDENT_AMBULATORY_CARE_PROVIDER_SITE_OTHER): Payer: BC Managed Care – PPO | Admitting: Gynecology

## 2011-04-01 VITALS — BP 122/78 | Ht <= 58 in | Wt 102.0 lb

## 2011-04-01 DIAGNOSIS — Z1211 Encounter for screening for malignant neoplasm of colon: Secondary | ICD-10-CM

## 2011-04-01 DIAGNOSIS — Z01419 Encounter for gynecological examination (general) (routine) without abnormal findings: Secondary | ICD-10-CM | POA: Insufficient documentation

## 2011-04-01 DIAGNOSIS — Z833 Family history of diabetes mellitus: Secondary | ICD-10-CM

## 2011-04-01 DIAGNOSIS — Z13220 Encounter for screening for lipoid disorders: Secondary | ICD-10-CM

## 2011-04-01 DIAGNOSIS — N898 Other specified noninflammatory disorders of vagina: Secondary | ICD-10-CM

## 2011-04-01 DIAGNOSIS — Z780 Asymptomatic menopausal state: Secondary | ICD-10-CM

## 2011-04-01 DIAGNOSIS — B373 Candidiasis of vulva and vagina: Secondary | ICD-10-CM

## 2011-04-01 MED ORDER — RALOXIFENE HCL 60 MG PO TABS: 60.0000 mg | ORAL_TABLET | Freq: Every day | ORAL | Status: DC

## 2011-04-01 MED ORDER — FLUCONAZOLE 150 MG PO TABS: 150.0000 mg | ORAL_TABLET | Freq: Once | ORAL | Status: AC

## 2011-04-01 NOTE — Patient Instructions (Signed)
Tienes infeccion de hongo. Tomar una tableta de Diflucam hoy y tendras refill en la farmacia si lo necesitas en el futuro. Comenzar a Dance movement psychotherapist. Recuerdate de hacer cita para el mammograma.

## 2011-04-01 NOTE — Progress Notes (Signed)
  Subjective:    Denise Edwards is a 60 y.o. female who presents for an annual exam. The patient has no complaints today. The patient is not sexually active. GYN screening history: last pap: was normal. The patient wears seatbelts: yes. The patient participates in regular exercise: no. Has the patient ever been transfused or tattooed?: no. The patient reports that there is not domestic violence in her life.   Menstrual History: OB History    Grav Para Term Preterm Abortions TAB SAB Ect Mult Living   2 1   1   1  1       Menarche age: 20 Patient's last menstrual period was 11/29/1997.    The following portions of the patient's history were reviewed and updated as appropriate: allergies, current medications, past family history, past medical history, past social history, past surgical history and problem list.  Review of Systems A comprehensive review of systems was negative.    Objective:    BP 122/78  Ht 4' 9.75" (1.467 m)  Wt 102 lb (46.267 kg)  BMI 21.50 kg/m2  LMP 11/29/1997 General appearance: alert and cooperative Head: Normocephalic, without obvious abnormality Eyes: conjunctivae/corneas clear. PERRL, EOM's intact. Fundi benign. Ears: normal TM's and external ear canals both ears Nose: Nares normal. Septum midline. Mucosa normal. No drainage or sinus tenderness. Throat: lips, mucosa, and tongue normal; teeth and gums normal Neck: no adenopathy, no carotid bruit, no JVD, supple, symmetrical, trachea midline and thyroid not enlarged, symmetric, no tenderness/mass/nodules Back: symmetric, no curvature. ROM normal. No CVA tenderness. Lungs: clear to auscultation bilaterally Breasts: normal appearance, no masses or tenderness, Inspection negative, No nipple retraction or dimpling, No nipple discharge or bleeding, No axillary or supraclavicular adenopathy, Normal to palpation without dominant masses Heart: regular rate and rhythm, S1, S2 normal, no murmur, click, rub or  gallop Abdomen: soft, non-tender; bowel sounds normal; no masses,  no organomegaly Pelvic: cervix normal in appearance, no adnexal masses or tenderness and rectovaginal septum normal Extremities: extremities normal, atraumatic, no cyanosis or edema Skin: Skin color, texture, turgor normal. No rashes or lesions.    Assessment:    Healthy female exam.  patient with her history in 1996 of a modified radical mastectomy and right flap reconstruction secondary to right infiltrating ductal carcinoma. She had 5/40 lymph nodes there were positive and subsequently received chemotherapy and finished her to her chemotherapy several years ago had been followed by her oncologist. She had been on Evista 60 mg daily for which prescription refill was provided for today she has a history of hypothyroidism for which her primary physician has her levothyroxine 50 mcg daily. She was concerned about losing weight have asked her to see her primary physician to maybe consider giving her megestrol acetate as a appetite stimulant and/or also to consider bringing down her Synthroid dose as well. She also has severe melasma for which I would refer her to the dermatologist at [facility]. She is due for her mammogram next month and will schedule accordingly. We discussed the importance of calcium 1250 mg daily and also vitamin D 2000 units daily. She is also to schedule her bone density study as well since her last was done 2 years ago.   Plan:     All questions answered. Await pap smear results. Blood tests: CBC with diff and cholesterol, blood sugar. Breast self exam technique reviewed and patient encouraged to perform self-exam monthly. Follow up as needed. KOH prep. Pap smear.

## 2011-04-02 NOTE — Progress Notes (Signed)
Addended byCammie Mcgee T on: 04/02/2011 10:10 AM   Modules accepted: Orders

## 2011-07-09 ENCOUNTER — Other Ambulatory Visit: Payer: Self-pay | Admitting: Oncology

## 2011-07-09 DIAGNOSIS — Z1231 Encounter for screening mammogram for malignant neoplasm of breast: Secondary | ICD-10-CM

## 2011-07-13 ENCOUNTER — Other Ambulatory Visit: Payer: Self-pay | Admitting: Gynecology

## 2011-08-02 ENCOUNTER — Ambulatory Visit
Admission: RE | Admit: 2011-08-02 | Discharge: 2011-08-02 | Disposition: A | Discharge status: Home / Self Care | Payer: BC Managed Care – PPO | Source: Ambulatory Visit | Attending: Oncology | Admitting: Oncology

## 2011-08-02 DIAGNOSIS — Z1231 Encounter for screening mammogram for malignant neoplasm of breast: Secondary | ICD-10-CM

## 2011-08-09 ENCOUNTER — Other Ambulatory Visit: Payer: Self-pay | Admitting: *Deleted

## 2011-08-09 MED ORDER — RALOXIFENE HCL 60 MG PO TABS: 60.0000 mg | ORAL_TABLET | Freq: Every day | ORAL | Status: DC

## 2011-08-12 ENCOUNTER — Other Ambulatory Visit: Payer: Self-pay | Admitting: *Deleted

## 2011-08-12 MED ORDER — RALOXIFENE HCL 60 MG PO TABS: 60.0000 mg | ORAL_TABLET | Freq: Every day | ORAL | Status: AC

## 2011-11-17 ENCOUNTER — Encounter: Payer: Self-pay | Admitting: Internal Medicine

## 2012-01-11 ENCOUNTER — Encounter: Payer: Self-pay | Admitting: Gynecology

## 2012-01-11 ENCOUNTER — Ambulatory Visit (INDEPENDENT_AMBULATORY_CARE_PROVIDER_SITE_OTHER): Payer: Self-pay | Admitting: Gynecology

## 2012-01-11 VITALS — BP 124/86

## 2012-01-11 DIAGNOSIS — N951 Menopausal and female climacteric states: Secondary | ICD-10-CM

## 2012-01-11 DIAGNOSIS — N644 Mastodynia: Secondary | ICD-10-CM

## 2012-01-11 DIAGNOSIS — Z780 Asymptomatic menopausal state: Secondary | ICD-10-CM

## 2012-01-11 DIAGNOSIS — E039 Hypothyroidism, unspecified: Secondary | ICD-10-CM

## 2012-01-11 DIAGNOSIS — L659 Nonscarring hair loss, unspecified: Secondary | ICD-10-CM

## 2012-01-11 DIAGNOSIS — R3915 Urgency of urination: Secondary | ICD-10-CM

## 2012-01-11 LAB — URINALYSIS W MICROSCOPIC + REFLEX CULTURE
Bilirubin Urine: NEGATIVE
Casts: NONE SEEN
Ketones, ur: NEGATIVE mg/dL
Nitrite: NEGATIVE
Urobilinogen, UA: 0.2 mg/dL (ref 0.0–1.0)

## 2012-01-11 LAB — TSH: TSH: 0.936 u[IU]/mL (ref 0.350–4.500)

## 2012-01-11 NOTE — Progress Notes (Signed)
Patient presented to the office today with several complaints the first complaint is she's had a few days of urinary urgency and suprapubic discomfort but no true dysuria, back pain, nausea, fever, or chills. She also was complaining of bilateral breast tenderness at times and some nailbed changes along with some hair loss. She does have history of hypothyroidism and has not had her thyroid checked in over year. Patient also has been suffering from depression and has not followed up with the psychiatrist and several years and would like to have a referral. She denies any suicidal ideation. Exam: Scalp: No balls spots noted Breasts: Both breasts were examined sitting supine position patient with prior TRAM flap of the right breast (past history of right infiltrating ductal carcinoma/modified radical mastectomy 1996). No palpable masses or tenderness no supraclavicular axillary lymphadenopathy  Abdomen: Some slight suprapubic tenderness Pelvic: Bartholin urethra Skene glands with atrophic changes Vagina: No gross lesions on inspection atrophic changes were noted Cervix: No lesions or discharge Bimanual exam: No palpable masses or tenderness Rectal exam: Deferred  Assessment/plan: Signs and symptoms suspicious for urinary tract infection although urinalysis demonstrated only 0-2 WBC and 3 to 6 RBC and rare bacteria. We'll send her urine for urine culture. She'll be given samples of Uribell antispasmodic agent to take 1 by mouth 4 times a day for 2 days. Because of her history of hypothyroidism and now some alopecia reported as well as nailbed changes we'll check a TSH today. For her mastodynia she was instructed to take vitamin D 600 units daily. Patient's last mammogram was normal in November 2012. She will be referred to Dr. Dub Mikes psychiatrist for followup which she saw several years ago. She does suffer time from vasomotor symptoms which could be attributed as well as to her being in the menopause as well  as being on Evista. I have recommended that she be placed on Effexor or Prestiq as an antidepressant which has been dual properties to help with her vasomotor symptoms. This will be related to her psychiatrist. All the above were discussed in Spanish and we'll follow accordingly. Of note she is overdue for bone density study and will be scheduled the next few weeks. She was reminded also to continue her monthly self breast examination.

## 2012-01-11 NOTE — Patient Instructions (Signed)
La vitamina E 600 unidades diaria ayuda desminuir la sensitividad de los senos. Tome Uribel una 4 veces al dia por los proximos dias para la vejiga hasta que llegue el cultivo urinario. Tome mucha aqua proximos dias.   Breast Tenderness Breast tenderness is a common complaint made by women of all ages. It is also called mastalgia or mastodynia, which means breast pain. The condition can range from mild discomfort to severe pain. It has a variety of causes. Your caregiver will find out the likely cause of your breast tenderness by examining your breasts, asking you about symptoms and perhaps ordering some tests. Breast tenderness usually does not mean you have breast cancer. CAUSES  Breast tenderness has many possible causes. They include:  Premenstrual changes. A week to 10 days before your period, your breasts might ache or feel tender.   Other hormonal causes. These include:   When sexual and physical traits mature (puberty).   Pregnancy.   The time right before and the year after menopause (perimenopause).   The day when it has been 12 months since your last period (menopause).   Large breasts.   Infection (also called mastitis).   Birth control pills.   Breastfeeding. Tenderness can occur if the breasts are overfull with milk or if a milk duct is blocked.   Injury.   Fibrocystic breast changes. This is not cancer (benign). It causes painful breasts that feel lumpy.   Fluid-filled sacs (cysts). Often cysts can be drained in your healthcare provider's office.   Fibroadenoma. This is a tumor that is not cancerous.   Medication side effects. Blood pressure drugs and diuretics (which increase urine flow) sometimes cause breast tenderness.   Previous breast surgery, such as a breast reduction.   Breast cancer. Cancer is rarely the reason breasts are tender. In most women, tenderness is caused by something else.  DIAGNOSIS  Several methods can be used to find out why your  breasts are tender. They include:  Visual inspection of the breasts.   Examination by hand.   Tests, such as:   Mammogram.   Ultrasound.   Biopsy.   Lab test of any fluid coming from the nipple.   Blood tests.   MRI.  TREATMENT  Treatment is directed to the cause of the breast tenderness from doing nothing for minor discomfort, wearing a good support bra but also may include:  Taking over-the-counter medicines for pain or discomfort as directed by your caregiver.   Prescription medicine for breast tenderness related to:   Premenstrual.   Fibrocystic.   Puberty.   Pregnancy.   Menopause.   Previous breast surgery.   Large breasts.   Antibiotics for infection.   Birth control pills for fibrocystic and premenstrual changes.   More frequent feedings or pumping of the breasts and warm compresses for breast engorgement when nursing.   Cold and warm compresses and a good support bra for most breast injuries.   Breast cysts are sometimes drained with a needle (aspiration) or removed with minor surgery.   Fibroadenomas are usually removed with minor surgery.   Changing or stopping the medicine when it is responsible for causing the breast tenderness.   When breast cancer is present with or without causing pain, it is usually treated with major surgery (with or without radiation) and chemotherapy.  HOME CARE INSTRUCTIONS  Breast tenderness often can be handled at home. You can try:  Getting fitted for a new bra that provides more support, especially during exercise.  Wearing a more supportive or sports bra while sleeping when your breasts are very tender.   If you have a breast injury, using an ice pack for 15 to 20 minutes. Wrap the pack in a towel. Do not put the ice pack directly on your breast.   If your breasts are too full of milk as a result of breastfeeding, try:   Expressing milk either by hand or with a breast pump.   Applying a warm compress for  relief.   Taking over-the-counter pain relievers, if this is OK with your caregiver.   Taking medicine that your caregiver prescribes. These might include antibiotics or birth control pills.  Over the long term, your breast tenderness might be eased if you:  Cut down on caffeine.   Reduce the amount of fat in your diet.  Also, learn how to do breast examinations at home. This will help you tell when you have an unusual growth or lump that could cause tenderness. And keep a log of the days and times when your breasts are most tender. This will help you and your caregiver find the right solution. SEEK MEDICAL CARE IF:   Any part of your breast is hard, red and hot to the touch. This could be a sign of infection.   Fluid is coming out of your nipples (and you are not breastfeeding). Especially watch for blood or pus.   You have a fever as well as breast tenderness.   You have a new or painful lump in your breast that remains after your period ends.   You have tried to take care of the pain at home, but it has not gone away.   Your breast pain is getting worse. Or, the pain is making it hard to do the things you usually do during your day.  Document Released: 08/05/2008 Document Revised: 08/12/2011 Document Reviewed: 08/05/2008 [facility] Patient Information 2012 [place], [place].

## 2012-01-13 LAB — URINE CULTURE: Colony Count: 5000

## 2012-01-18 ENCOUNTER — Ambulatory Visit (INDEPENDENT_AMBULATORY_CARE_PROVIDER_SITE_OTHER): Payer: BC Managed Care – PPO

## 2012-01-18 DIAGNOSIS — M899 Disorder of bone, unspecified: Secondary | ICD-10-CM

## 2012-01-18 DIAGNOSIS — M949 Disorder of cartilage, unspecified: Secondary | ICD-10-CM

## 2012-01-18 DIAGNOSIS — M8580 Other specified disorders of bone density and structure, unspecified site: Secondary | ICD-10-CM

## 2012-01-18 DIAGNOSIS — Z780 Asymptomatic menopausal state: Secondary | ICD-10-CM

## 2012-01-24 ENCOUNTER — Encounter: Payer: Self-pay | Admitting: Gynecology

## 2012-01-24 ENCOUNTER — Ambulatory Visit (INDEPENDENT_AMBULATORY_CARE_PROVIDER_SITE_OTHER): Payer: BC Managed Care – PPO | Admitting: Gynecology

## 2012-01-24 ENCOUNTER — Other Ambulatory Visit: Payer: Self-pay | Admitting: Gynecology

## 2012-01-24 ENCOUNTER — Other Ambulatory Visit (INDEPENDENT_AMBULATORY_CARE_PROVIDER_SITE_OTHER): Payer: BC Managed Care – PPO

## 2012-01-24 VITALS — BP 116/70

## 2012-01-24 DIAGNOSIS — M1990 Unspecified osteoarthritis, unspecified site: Secondary | ICD-10-CM | POA: Insufficient documentation

## 2012-01-24 DIAGNOSIS — M949 Disorder of cartilage, unspecified: Secondary | ICD-10-CM

## 2012-01-24 DIAGNOSIS — M899 Disorder of bone, unspecified: Secondary | ICD-10-CM

## 2012-01-24 DIAGNOSIS — M8580 Other specified disorders of bone density and structure, unspecified site: Secondary | ICD-10-CM

## 2012-01-24 NOTE — Progress Notes (Signed)
Patient with history in 1996 of a modified radical mastectomy and right flap reconstruction secondary to right infiltrating ductal carcinoma. She had 5/40 lymph nodes there were positive and subsequently received chemotherapy and finished her to her chemotherapy several years ago had been followed by her oncologist. She had been on Evista 60 mg daily for which prescription refill was provided for today she has a history of hypothyroidism for which her primary physician has her levothyroxine 50 mcg daily. Patient here today to discuss her recent bone density study.  Bone density study done in [facility] gynecology on 01/18/2012 was compared with previous study of 10/01/2008. There was statistically significant decrease in bone mineralization at the left hip (-6.1%) with T score of -1.3. The AP spine and the right hip otherwise stable. Her overall lowest T score at the AP spine and -2.1. She did have a Frax analysis which demonstrated that her 10 year risk of fracture overall is 14% and her overall 10 year fracture risk of the hip is 0.7% both supple threshold.  Assessment/plan: Osteopenia A calcium vitamin D and PTH level will be drawn today. I would like her to stay on the if this the because of her past history of breast cancer. I have reminded her to continue to take her calcium 1200 mg daily to 1500 mg along with 2000 units of vitamin D and to continue to engage in weightbearing exercises 3-4 times a week. Patient has informed me that her joint pains has continued to get worse and her knee pains. Did not change during the day. It appears she is suffering from osteoarthritis and will be referred to rheumatologist for further evaluation and treatment.

## 2012-01-24 NOTE — Patient Instructions (Signed)
Osteoartritis (Osteoarthritis) La osteoartritis es la ms frecuente de las enfermedades artrticas. Es la inflamacin, dolor e hinchazn en un cartlago. El cartlago acta como una almohadilla que cubre los extremos de los huesos que forman una articulacin. CAUSAS Con el paso del [place], el cartlago se gasta. Esto hace que los huesos se froten entre s. Entonces aparece el dolor y la rigidez en la articulacin afectada. Los factores que contribuyen a este problema son:  Manson Allan de Runner, broadcasting/film/video.   La edad.   Uso excesivo de Nurse, learning disability.  SNTOMAS  Las personas que sufren osteoartritis experimentan Engineer, mining, hinchazn y rigidez en las articulaciones.   Con el tiempo, la articulacin pierde su forma normal.   Pequeos depsitos de hueso (osteofitos) pueden desarrollarse en los extremos de Nurse, learning disability.   Algunos trozos de [employer] o cartlago pueden separarse y flotar dentro del espacio de la articulacin. Esto puede causar ms dolor y lesiones.   La osteoartritis tambin causa depresin, ansiedad, sensacin de impotencia y limitaciones en las actividades diarias.  Las articulaciones ms afectadas son:  Extremos de los dedos.   Pulgares   El cuello   La zona inferior de la espalda.   Las rodillas.   Las caderas  DIAGNSTICO El diagnstico se realiza basndose en los sntomas y el examen fsico. Hay estudios que pueden ser de ayuda en el diagnstico tales como:  Radiografas de la articulacin afectada.   Resonancia magntica (RMN).   Anlisis de sangre para descartar otros tipos de artritis.   Anlisis de los fluidos de Nurse, learning disability. Para ello se [place] y se extrae lquido de la articulacin afectada y se examina con el microscopio.  TRATAMIENTO Los [employer] del tratamiento son Human resources officer, mejorar la funcin de Nurse, learning disability, Pharmacologist un peso corporal adecuado y Pharmacologist un estilo de vida saludable. Los tratamientos pueden ser:  Jethro Poling de  actividad fsica prescripto para reposo y Barista de Nurse, learning disability.   Control del peso con educacin nutricional,   Tcnicas de alivio del dolor como:   Aplicacin correcta de fro y calor.   Impulsos elctricos enviados a las terminaciones nerviosas que se encuentran debajo de la piel (estimulacin elctrica nerviosa transcutnea).   Masajes.   Ciertos suplementos. Consulte con su mdico antes de usar cualquier suplemento, especialmente en combinacin con los medicamentos prescriptos.   Medicamentos para Human resources officer como:   Acetaminofeno.   Antiinflamatorios no esteroides (AINE), como el naproxeno.   Narcticos o agentes de accin central, como el tramadol. Estos medicamentos tienen el riesgo de adiccin y generalmente se prescriben para ser utilizados por un perodo breve.   Corticoides. Pueden administrarse por va oral o inyectable. Este es un tratamiento a Product manager, y no se recomienda para uso de [place].   Ciruga para reposicionar los [employer] y Engineer, materials (osteotoma) o para retirar las piezas sueltas de hueso y TEFL teacher. Puede ser necesario el reemplazo de las articulaciones en estadios avanzados de la enfermedad.  INSTRUCCIONES PARA EL CUIDADO DOMICILIARIO El mdico puede recomendar tipos especficos de ejercicios. Aqu se incluyen:  Ejercicios de fortalecimiento Se realizan para fortalecer los msculos que sostienen las articulaciones afectadas por la artritis. Pueden realizarse con peso o con bandas para agregar resistencia.   Doroteo Glassman Carroll Sage. Son Programmer, applications a paso ligero, gimnasia [place] de bajo impacto, que acelere el corazn. Ayudan a [employer] y Copywriter, advertising circulatorio en forma.   Actividades de amplitud de movimientos. Dan agilidad a las articulaciones.  Ejercicios de equilibrio y [place]. Ayudan a McK[employer]on se necesitan para la vida diaria.  Aprender acerca de su enfermedad y permanecer activamente  involucrado en el tratamiento lo ayudarn a mejorar el curso de la osteoartritis. SOLICITE ATENCIN MDICA SI:  Siente que le sube la temperatura o la piel se enrojece.   Aparece una erupcin adems del dolor en la articulacin.   La temperatura oral se eleva sin motivo por arriba de 102 F (38.9 C).  Irven Shelling MS [facility] of Arthritis and Musculoskeletal and Skin Diseases : [employer] para la Artritis y las Enfermedades Musculoesquelticas y Arboriculturist. www.niams.http://www.myers.net/ [employer] on [employer] el Envejecimiento: https://walker.com/ American College of Rheumatology  (Instituto Norteamericano de Reumatologa): www.rheumatology.org Document Released: 06/02/2005 Document Revised: 08/12/2011 [facility] Patient Information 2012 [place], [place].  Osteoarthritis Osteoarthritis is the most common form of arthritis. It is redness, soreness, and swelling (inflammation) affecting the cartilage. Cartilage acts as a cushion, covering the ends of bones where they meet to form a joint. CAUSES  Over time, the cartilage begins to wear away. This causes bone to rub on bone. This produces pain and stiffness in the affected joints. Factors that contribute to this problem are:  Excessive body weight.   Age.   Overuse of joints.  SYMPTOMS   People with osteoarthritis usually experience joint pain, swelling, or stiffness.   Over time, the joint may lose its normal shape.   Small deposits of bone (osteophytes) may grow on the edges of the joint.   Bits of bone or cartilage can break off and float inside the joint space. This may cause more pain and damage.   Osteoarthritis can lead to depression, anxiety, feelings of helplessness, and limitations on daily activities.  The most commonly affected joints are in the:  Ends of the fingers.   Thumbs.   Neck.   Lower back.   Knees.   Hips.  DIAGNOSIS  Diagnosis is mostly based on your  symptoms and exam. Tests may be helpful, including:  X-rays of the affected joint.   A computerized magnetic scan (MRI).   Blood tests to rule out other types of arthritis.   Joint fluid tests. This involves using a needle to draw fluid from the joint and examining the fluid under a microscope.  TREATMENT  Goals of treatment are to control pain, improve joint function, maintain a normal body weight, and maintain a healthy lifestyle. Treatment approaches may include:  A prescribed exercise program with rest and joint relief.   Weight control with nutritional education.   Pain relief techniques such as:   Properly applied heat and cold.   Electric pulses delivered to nerve endings under the skin (transcutaneous electrical nerve stimulation, TENS).   Massage.   Certain supplements. Ask your caregiver before using any supplements, especially in combination with prescribed drugs.   Medicines to control pain, such as:   Acetaminophen.   Nonsteroidal anti-inflammatory drugs (NSAIDs), such as naproxen.   Narcotic or central-acting agents, such as tramadol. This drug carries a risk of addiction and is generally prescribed for short-term use.   Corticosteroids. These can be given orally or as injection. This is a short-term treatment, not recommended for routine use.   Surgery to reposition the bones and relieve pain (osteotomy) or to remove loose pieces of bone and cartilage. Joint replacement may be needed in advanced states of osteoarthritis.  HOME CARE INSTRUCTIONS  Your caregiver can recommend specific types of exercise. These  may include:  Strengthening exercises. These are done to strengthen the muscles that support joints affected by arthritis. They can be performed with weights or with exercise bands to add resistance.   Aerobic activities. These are exercises, such as brisk walking or low-impact aerobics, that get your heart pumping. They can help keep your lungs and  circulatory system in shape.   Range-of-motion activities. These keep your joints limber.   Balance and agility exercises. These help you maintain daily living skills.  Learning about your condition and being actively involved in your care will help improve the course of your osteoarthritis. SEEK MEDICAL CARE IF:   You feel hot or your skin turns red.   You develop a rash in addition to your joint pain.   You have an oral temperature above 102 F (38.9 C).  FOR MORE INFORMATION  National Institute of Arthritis and Musculoskeletal and Skin Diseases: www.niams.http://www.myers.net/ [employer] on Aging: https://walker.com/ American College of Rheumatology: www.rheumatology.org Document Released: 08/23/2005 Document Revised: 08/12/2011 Document Reviewed: 12/04/2009 [facility] Patient Information 2012 [place], [place].

## 2012-01-25 LAB — PTH, INTACT AND CALCIUM: PTH: 14.7 pg/mL (ref 14.0–72.0)

## 2012-01-27 ENCOUNTER — Telehealth: Payer: Self-pay | Admitting: *Deleted

## 2012-01-27 NOTE — Telephone Encounter (Signed)
Pt informed of appt with rhuematologist. Sent referral to Dr Hurley Cisco office. Apt 02/15/12 at 130pm. To see Dr Dareen Piano. KW

## 2012-02-16 ENCOUNTER — Telehealth: Payer: Self-pay | Admitting: *Deleted

## 2012-02-16 DIAGNOSIS — Z01419 Encounter for gynecological examination (general) (routine) without abnormal findings: Secondary | ICD-10-CM

## 2012-02-16 DIAGNOSIS — N898 Other specified noninflammatory disorders of vagina: Secondary | ICD-10-CM

## 2012-02-16 DIAGNOSIS — Z780 Asymptomatic menopausal state: Secondary | ICD-10-CM

## 2012-02-16 MED ORDER — RALOXIFENE HCL 60 MG PO TABS: 60.0000 mg | ORAL_TABLET | Freq: Every day | ORAL | Status: DC

## 2012-02-16 NOTE — Telephone Encounter (Signed)
Pt asked if her rx for Evista 60 mg tablets be sent to [facility] on [address]. Her husband has new insurance. Pt due to annual in July,transferred to front desk to schedule. 1 month supply sent.

## 2012-03-21 ENCOUNTER — Telehealth: Payer: Self-pay | Admitting: *Deleted

## 2012-03-21 DIAGNOSIS — N898 Other specified noninflammatory disorders of vagina: Secondary | ICD-10-CM

## 2012-03-21 DIAGNOSIS — Z780 Asymptomatic menopausal state: Secondary | ICD-10-CM

## 2012-03-21 DIAGNOSIS — Z01419 Encounter for gynecological examination (general) (routine) without abnormal findings: Secondary | ICD-10-CM

## 2012-03-21 MED ORDER — RALOXIFENE HCL 60 MG PO TABS: 60.0000 mg | ORAL_TABLET | Freq: Every day | ORAL | Status: DC

## 2012-03-21 NOTE — Telephone Encounter (Signed)
Pt calling requesting refill on Evista 60 mg tablet, pt annual scheduled for 04/03/12, #30 will be sent to pharmacy.

## 2012-04-03 ENCOUNTER — Other Ambulatory Visit (HOSPITAL_COMMUNITY)
Admission: RE | Admit: 2012-04-03 | Discharge: 2012-04-03 | Disposition: A | Discharge status: Home / Self Care | Payer: BC Managed Care – PPO | Source: Ambulatory Visit | Attending: Gynecology | Admitting: Gynecology

## 2012-04-03 ENCOUNTER — Encounter: Payer: Self-pay | Admitting: Gynecology

## 2012-04-03 ENCOUNTER — Ambulatory Visit (INDEPENDENT_AMBULATORY_CARE_PROVIDER_SITE_OTHER): Payer: BC Managed Care – PPO | Admitting: Gynecology

## 2012-04-03 VITALS — BP 118/70 | Ht <= 58 in | Wt 108.0 lb

## 2012-04-03 DIAGNOSIS — F411 Generalized anxiety disorder: Secondary | ICD-10-CM

## 2012-04-03 DIAGNOSIS — Z1151 Encounter for screening for human papillomavirus (HPV): Secondary | ICD-10-CM | POA: Insufficient documentation

## 2012-04-03 DIAGNOSIS — F419 Anxiety disorder, unspecified: Secondary | ICD-10-CM | POA: Insufficient documentation

## 2012-04-03 DIAGNOSIS — Z01419 Encounter for gynecological examination (general) (routine) without abnormal findings: Secondary | ICD-10-CM | POA: Insufficient documentation

## 2012-04-03 DIAGNOSIS — F329 Major depressive disorder, single episode, unspecified: Secondary | ICD-10-CM

## 2012-04-03 NOTE — Progress Notes (Signed)
Denise Edwards 1951-01-16 [id number]   History:    61 y.o.  for annual gyn exam  with history in 1996 of a modified radical mastectomy and right flap reconstruction secondary to right infiltrating ductal carcinoma. She had 5/14 lymph nodes there were positive and subsequently received chemotherapy and finished her   chemotherapy several years ago had been followed by her oncologist. Patient has 2 sisters and mother with breast cancer. Patient has had laparoscopic bilateral salpingo-oophorectomy in the past. She had been on Evista 60 mg daily and has a history of hypothyroidism for which her primary physician has her levothyroxine 50 mcg daily. Dr. Luiz Edwards her primary physician has done her lab work recently. Her last mammogram was normal in November 2012. Her colonoscopy was normal in 2006. Patient does suffer from osteoarthritis is in the process of seeing a chiropractor. She also has suffer from many years for depression anxiety now worse than ever since her separation from her spouse. She's in the process of following up with her psychiatrist for medication adjustment for which she feels is given her side effects such as loss of balance at times and moments of forgetfulness. Patient would know prior history of abnormal Pap smears.  Past medical history,surgical history, family history and social history were all reviewed and documented in the EPIC chart.  Gynecologic History Patient's last menstrual period was 11/29/1997. Contraception: none Last Pap: 2012. Results were: normal Last mammogram: 2012. Results were: normal  Obstetric History OB History    Grav Para Term Preterm Abortions TAB SAB Ect Mult Living   2 1 1  1   1  1      # Outc Date GA Lbr Len/2nd Wgt Sex Del Anes PTL Lv   1 ECT            2 TRM     M CS Local No Yes       ROS: A ROS was performed and pertinent positives and negatives are included in the history.  GENERAL: No fevers or chills. HEENT: No change in vision, no earache,  sore throat or sinus congestion. NECK: No pain or stiffness. CARDIOVASCULAR: No chest pain or pressure. No palpitations. PULMONARY: No shortness of breath, cough or wheeze. GASTROINTESTINAL: No abdominal pain, nausea, vomiting or diarrhea, melena or bright red blood per rectum. GENITOURINARY: No urinary frequency, urgency, hesitancy or dysuria. MUSCULOSKELETAL: No joint or muscle pain, no back pain, no recent trauma. DERMATOLOGIC: No rash, no itching, no lesions. ENDOCRINE: No polyuria, polydipsia, no heat or cold intolerance. No recent change in weight. HEMATOLOGICAL: No anemia or easy bruising or bleeding. NEUROLOGIC: No headache, seizures, numbness, tingling or weakness. PSYCHIATRIC: No depression, no loss of interest in normal activity or change in sleep pattern.     Exam: chaperone present  BP 118/70  Ht 4' 9.75" (1.467 m)  Wt 108 lb (48.988 kg)  BMI 22.77 kg/m2  LMP 11/29/1997  Body mass index is 22.77 kg/(m^2).  General appearance : Well developed well nourished female. No acute distress HEENT: Neck supple, trachea midline, no carotid bruits, no thyroidmegaly Lungs: Clear to auscultation, no rhonchi or wheezes, or rib retractions  Heart: Regular rate and rhythm, no murmurs or gallops Breast:Examined in sitting and supine position were symmetrical in appearance, no palpable masses or tenderness,  no skin retraction, no nipple inversion, no nipple discharge, no skin discoloration, no axillary or supraclavicular lymphadenopathy Abdomen: no palpable masses or tenderness, no rebound or guarding Extremities: no edema or skin discoloration or tenderness  Pelvic:  Bartholin, Urethra, Skene Glands: Within normal limits             Vagina: No gross lesions or discharge  Cervix: No gross lesions or discharge  Uterus  axial, normal size, shape and consistency, non-tender and mobile  Adnexa  Without masses or tenderness  Anus and perineum  normal   Rectovaginal  normal sphincter tone without  palpated masses or tenderness             Hemoccult cards provided     Assessment/Plan:  61 y.o. female for annual exam Pap smear was done today but no lab work were drawn. She was seen the office recently to discuss her bone density study which she has osteopenia with normal Frax scores. Her calcium vitamin D and PTH were normal in May 2013. She will continue also on her Evista 60 mg daily. Hemoccult cards were provided. She will followup with her psychiatrist and her chiropractor. If she does not get resolution from osteoarthritis from her chiropractor she will contact our office so we can refer her to a different rheumatologist that she saw before for a second opinion.    [name] MD, 10:20 AM 04/03/2012

## 2012-04-03 NOTE — Addendum Note (Signed)
Addended by: Bertram Savin A on: 04/03/2012 10:35 AM   Modules accepted: Orders

## 2012-04-03 NOTE — Patient Instructions (Addendum)
Mantenimiento de la salud en las mujeres (Health Maintenance, Females) Un estilo de vida saludable y los cuidados preventivos pueden favorecer la salud y el bienestar.   Haga exmenes regulares de la salud en general, dentales y de los ojos.   Consuma una dieta saludable. Los alimentos como vegetales, frutas, granos enteros, productos lcteos descremados y protenas magras contienen los nutrientes que usted necesita sin necesidad de consumir muchas caloras. Disminuya el consumo de alimentos con alto contenido de grasas slidas, azcar y sal agregadas. Si es necesario, pdaleinformacin acerca de una dieta adecuada a su mdico.   La actividad fsica regular es una de las cosas ms importantes que puede hacer por su salud. Los adultos deben hacer al menos 150 minutos de ejercicios de intensidad moderada (cualquier actividad que aumente la frecuencia cardaca y lo haga transpirar) cada semana. Adems, la mayora de los adultos necesita ejercicios de fortalecimiento muscular 2  ms das por semana.    Mantenga un peso saludable. El ndice de masa corporal (IMC) es una herramienta que identifica posibles problemas con el peso. Proporciona una estimacin de la grasa corporal basndose en el peso y la altura. El mdico podr determinar su IMC y podr ayudarlo a lograr o mantener un peso saludable. Para los adultos de 20 aos o ms:   Un IMC menor a 18,5 se considera bajo peso.   Un IMC entre 18,5 y 24,9 es normal.   Un IMC entre 25 y 29,9 es sobrepeso.   Un IMC entre 30 o ms es obesidad.   Mantenga un nivel normal de lpidos y colesterol en sangre practicando actividad fsica y minimizando la ingesta de grasas saturadas. Consuma una dieta balanceada e incluya variedad de frutas y vegetales. Los anlisis de lpidos y colesterol en sangre deben comenzar a los 20 aos y repetirse cada 5 aos. Si los niveles de colesterol son altos, tiene ms de 50 aos o tiene riesgo elevado de sufrir enfermedades  cardacas, necesitar controlarse con ms frecuencia.Si tiene niveles elevados de lpidos y colesterol, debe recibir tratamiento con medicamentos, si la dieta y el ejercicio no son efectivos.   Si fuma, consulte con el profesional acerca de las opciones para dejar de hacerlo. Si no lo hace, no comience.   Si est embarazada no beba alcohol. Si est amamantando, beba alcohol con prudencia. Si elige beber alcohol, no se exceda de 1 medida por da. Se considera una medida a 12 onzas (355 ml) de cerveza, 5 onzas (148 ml) de vino, o 1,5 onzas (44 ml) de licor.   Evite el alcohol y el consumo de drogas. No comparta agujas. Pida ayuda si necesita asistencia o instrucciones con respecto a abandonar el consumo de alcohol, cigarrillos o drogas.   La hipertensin arterial causa enfermedades cardacas y aumenta el riesgo de ictus. Debe controlar su presin arterial al menos cada 1 o 2 aos. La presin arterial elevada que persiste debe tratarse con medicamentos si la prdida de peso y el ejercicio no son efectivos.   Si tiene entre 55 y 79 aos, consulte a su mdico si debe tomar aspirina para prevenir enfermedades cardacas.   Los anlisis para la diabetes incluyen la toma de una muestra de sangre para controlar el nivel de azcar en la sangre durante el ayuno. Debe hacerlo cada 3 aos despus de los 45 aos si est dentro de su peso normal y sin factores de riesgo para la diabetes. Las pruebas deben comenzar a edades tempranas o llevarse a cabo con ms frecuencia   si tiene sobrepeso y al menos 1 factor de riesgo para la diabetes.   Las evaluaciones para detectar el cncer de mama son un mtodo preventivo fundamental para las mujeres. Debe practicar la "autoconciencia de las mamas". Esto significa que debe reconocer la apariencia normal de sus mamas y como las siente y pudiendo incluir un autoexamen de mamas. Si detecta algn cambio, no importa cun pequeo sea, debe informarlo a su mdico. Las mujeres entre 20 y  40 aos deben hacer un examen clnico de las mamas como parte del examen regular de salud, cada 1 a 3 aos. Despus de los 40 aos deben hacerlo todos los aos. Deben hacerse una mamografa radografa de mamas ) cada ao, comenzando a los 40 aos. Las mujeres con historia familiar de cncer de mama deben hablar con el mdico para hacer un estudio gentico. Las que tienen ms riesgo deben hacerse resonancia magntica y una mamografa todos los aos.   Un test de Pap se realiza para diagnosticar cncer de cuello de tero. Las mujeres deben hacerse un test de Pap a partir de los 21 aos. Entre los 21 y los 29 aos debe repetirse cada dos aos. Luego de los 30 aos, debe realizarse un test de Pap cada tres aos siempre que los 3 estudios anteriores sean normales. Si le han realizado una histerectoma por un problema que no era cncer u otra enfermedad que podra causar cncer, ya no necesitar un test de Pap. Si tiene entre 65 y 70 aos y ha tenido un test de Pap normal en los ltimos 10 aos, ya no ser necesario realizarlo. Si ha recibido un tratamiento para el cncer cervical o para una enfermedad que podra causar cncer, necesitar realizar un test de Pap y controles durante al menos 20 aos de concluir el tratamiento. Si no se ha hecho el examen con regularidad, debern volver a evaluarse los factores de riesgo (como el tener un nuevo compaero sexual) para determinar si debe volver a realizarse los estudios. Algunas mujeres sufren problemas mdicos que aumentan la probabilidad de contraer cncer cervical. En estos casos, el mdico podr indicar que se realice el test de Pap con ms frecuencia.   La prueba del virus del papiloma humano (VPH) es un anlisis adicional que puede usarse para detectar cncer de cuello de tero. Esta prueba busca la presencia del virus que causa los cambios en el cuello. Las clulas que se recolectan durante el test de Pap pueden usarse para el VPH. La prueba para el VPH puede  usarse para evaluar a mujeres de ms de 30 aos y debe usarse en mujeres de cualquier edad cuyos resultados del test de Pap no sean claros. Despus de los 30 aos, las mujeres deben hacerse el anlisis para el VPH con la misma frecuencia que el test de Pap.   El cncer colorectal puede detectarse y con frecuencia puede prevenirse. La mayor parte de los estudios de rutina comienzan a los 50 aos y continan hasta los 75 aos. Sin embargo, el mdico podr aconsejarle que lo haga antes, si tiene factores de riesgo para el cncer de colon. Una vez por ao, el profesional le dar un kit de prueba para hallar sangre oculta en la materia fecal. La utilizacin de un tubo con una pequea cmara en su extremo para examinar directamente el colon (sigmoidoscopa o colonoscopa), puede detectar formas temprana de cncer colorectal. Hable con su mdico si tiene 50 aos, cuando comience con los estudios de rutina. El examen directo del   colon debe repetirse cada 5 a 10 aos, hasta los 75 aos, excepto que se encuentren formas tempranas de plipos precancerosos o pequeos bultos.   Se recomienda realizar un anlisis de sangre para detectar hepatitis C a todas las personas nacidas entre 1945 y 1965, y a todo aquel que tenga un riesgo conocido de haber contrado esta enfermedad.   Practique el sexo seguro. Use condones y evite las prcticas sexuales riesgosas para disminuir el contagio de enfermedades de transmisin sexual. Las mujeres sexualmente activas de 25 aos o menos deben controlarse para descartar clamidia, que es una infeccin de transmisin sexual frecuente. Las mujeres mayores que tengan mltiples compaeros tambin deben hacerse el anlisis para detectar clamidia. Se recomienda realizar anlisis para detectar otras enfermedades de transmisin sexual si es sexualmente activa y tiene riesgos.   La osteoporosis es una enfermedad en la que los huesos pierden los minerales y la fuerza por el avance de la edad. El  resultado pueden ser fracturas graves en los huesos. El riesgo de osteoporosis puede identificarse con una prueba de densidad sea. Las mujeres de ms de 65 aos y las que tengan riesgos de sufrir fracturas u osteoporosis deben pedir consejo a su mdico. Consulte a su mdico si debe tomar un suplemento de calcio o de vitamina D para reducir el riesgo de osteoporosis.   La menopausia se asocia a sntomas y riesgos fsicos. Se dispone de una terapia de reemplazo hormonal para disminuir los sntomas y los riesgos. Consulte a su mdico para saber si la terapia de reemplazo hormonal es conveniente para usted.   Use una pantalla solar con un factor SPF de 30 o mayor. Aplique pantalla de manera libre y repetida a lo largo del da. Pngase al resguardo del sol cuando la sombra sea ms pequea que usted. Protjase usando mangas y pantalones largos, un sombrero de ala ancha y gafas para el sol todo el ao, siempre que se encuentre en el exterior.   Informe a su mdico si aparecen nuevos lunares o los que tiene se modifican, especialmente en forma y color. Tambin notifique al mdico si un lunar es ms grande que el tamao de una goma de lpiz.   Mantngase al da con las vacunas.  Document Released: 08/12/2011 [employer] Patient Information 2012 [employer]. 

## 2012-04-27 ENCOUNTER — Other Ambulatory Visit: Payer: Self-pay | Admitting: *Deleted

## 2012-04-27 DIAGNOSIS — Z01419 Encounter for gynecological examination (general) (routine) without abnormal findings: Secondary | ICD-10-CM

## 2012-04-27 DIAGNOSIS — Z780 Asymptomatic menopausal state: Secondary | ICD-10-CM

## 2012-04-27 DIAGNOSIS — N898 Other specified noninflammatory disorders of vagina: Secondary | ICD-10-CM

## 2012-04-27 MED ORDER — RALOXIFENE HCL 60 MG PO TABS: 60.0000 mg | ORAL_TABLET | Freq: Every day | ORAL | Status: AC

## 2012-05-30 ENCOUNTER — Other Ambulatory Visit: Payer: Self-pay | Admitting: *Deleted

## 2012-05-30 MED ORDER — LEVOTHYROXINE SODIUM 50 MCG PO TABS: 50.0000 ug | ORAL_TABLET | Freq: Every day | ORAL | Status: AC

## 2012-06-16 ENCOUNTER — Telehealth: Payer: Self-pay | Admitting: *Deleted

## 2012-06-16 ENCOUNTER — Other Ambulatory Visit: Payer: Self-pay | Admitting: Oncology

## 2012-06-16 NOTE — Telephone Encounter (Signed)
(  JF patient) Pt is calling c/o left side breast pain x 3 days now, she has a screening mammogram due in November. Pt asked if she could have an order for diag. Mammogram? Pt has family history of breast cancer mother & sister. I told pt OV best, but pt asking for order. Please advise

## 2012-06-16 NOTE — Telephone Encounter (Signed)
Pt informed with the below note. 

## 2012-06-16 NOTE — Telephone Encounter (Signed)
Recommend office visit with Dr. Lily Peer beginning of next week to let him examine her before ordering testing.

## 2012-06-20 ENCOUNTER — Ambulatory Visit (INDEPENDENT_AMBULATORY_CARE_PROVIDER_SITE_OTHER): Payer: BC Managed Care – PPO | Admitting: Gynecology

## 2012-06-20 ENCOUNTER — Encounter: Payer: Self-pay | Admitting: Gynecology

## 2012-06-20 ENCOUNTER — Telehealth: Payer: Self-pay | Admitting: *Deleted

## 2012-06-20 VITALS — BP 108/70

## 2012-06-20 DIAGNOSIS — N644 Mastodynia: Secondary | ICD-10-CM

## 2012-06-20 NOTE — Progress Notes (Signed)
Patient presented to the office today complaining for the past several months of the left axillary left-sided breast pains on and off with a pulling like sensation. Patient has a history of a modified radical mastectomy and right flap reconstruction secondary to right infiltrating ductal carcinoma. She had 5/14 lymph nodes there were positive and subsequently received chemotherapy and finished her chemotherapy several years ago had been followed by her oncologist. Patient has 2 sisters and mother with breast cancer. Patient has had laparoscopic bilateral salpingo-oophorectomy in the past. She had been on Evista 60 mg daily and has a history of hypothyroidism for which her primary physician has her levothyroxine 50 mcg daily.    Exam: Both breasts were examined sitting supine position right breast with evidence of previous flap reconstruction. No palpable masses no supraclavicular or axillary lymphadenopathy. Left breast no discoloration no nipple retraction no skin discoloration no supraclavicular axillary lymphadenopathy and no discernible mass. She was tender 3 finger breast from the areolar region at the 3:00 position towards the axilla but no discernible mass per se.  Patient will be sent to the radiology department for a 3-D mammogram of the left breast and screening mammogram of the right. Patient will be reminded to cut down or caffeine-containing products to help with her mastodynia. She does await exercises and this could be musculoskeletal related although it comes and goes is not on a daily occurrence. She was scheduled for a screening mammogram next month so we will move it up for her next week.

## 2012-06-20 NOTE — Patient Instructions (Addendum)
Breast Tenderness Breast tenderness is a common complaint made by women of all ages. It is also called mastalgia or mastodynia, which means breast pain. The condition can range from mild discomfort to severe pain. It has a variety of causes. Your caregiver will find out the likely cause of your breast tenderness by examining your breasts, asking you about symptoms and perhaps ordering some tests. Breast tenderness usually does not mean you have breast cancer. CAUSES  Breast tenderness has many possible causes. They include:  Premenstrual changes. A week to 10 days before your period, your breasts might ache or feel tender.  Other hormonal causes. These include:  When sexual and physical traits mature (puberty).  Pregnancy.  The time right before and the year after menopause (perimenopause).  The day when it has been 12 months since your last period (menopause).  Large breasts.  Infection (also called mastitis).  Birth control pills.  Breastfeeding. Tenderness can occur if the breasts are overfull with milk or if a milk duct is blocked.  Injury.  Fibrocystic breast changes. This is not cancer (benign). It causes painful breasts that feel lumpy.  Fluid-filled sacs (cysts). Often cysts can be drained in your healthcare provider's office.  Fibroadenoma. This is a tumor that is not cancerous.  Medication side effects. Blood pressure drugs and diuretics (which increase urine flow) sometimes cause breast tenderness.  Previous breast surgery, such as a breast reduction.  Breast cancer. Cancer is rarely the reason breasts are tender. In most women, tenderness is caused by something else. DIAGNOSIS  Several methods can be used to find out why your breasts are tender. They include:  Visual inspection of the breasts.  Examination by hand.  Tests, such as:  Mammogram.  Ultrasound.  Biopsy.  Lab test of any fluid coming from the nipple.  Blood tests.  MRI. TREATMENT    Treatment is directed to the cause of the breast tenderness from doing nothing for minor discomfort, wearing a good support bra but also may include:  Taking over-the-counter medicines for pain or discomfort as directed by your caregiver.  Prescription medicine for breast tenderness related to:  Premenstrual.  Fibrocystic.  Puberty.  Pregnancy.  Menopause.  Previous breast surgery.  Large breasts.  Antibiotics for infection.  Birth control pills for fibrocystic and premenstrual changes.  More frequent feedings or pumping of the breasts and warm compresses for breast engorgement when nursing.  Cold and warm compresses and a good support bra for most breast injuries.  Breast cysts are sometimes drained with a needle (aspiration) or removed with minor surgery.  Fibroadenomas are usually removed with minor surgery.  Changing or stopping the medicine when it is responsible for causing the breast tenderness.  When breast cancer is present with or without causing pain, it is usually treated with major surgery (with or without radiation) and chemotherapy. HOME CARE INSTRUCTIONS  Breast tenderness often can be handled at home. You can try:  Getting fitted for a new bra that provides more support, especially during exercise.  Wearing a more supportive or sports bra while sleeping when your breasts are very tender.  If you have a breast injury, using an ice pack for 15 to 20 minutes. Wrap the pack in a towel. Do not put the ice pack directly on your breast.  If your breasts are too full of milk as a result of breastfeeding, try:  Expressing milk either by hand or with a breast pump.  Applying a warm compress for relief.    Taking over-the-counter pain relievers, if this is OK with your caregiver.  Taking medicine that your caregiver prescribes. These might include antibiotics or birth control pills. Over the long term, your breast tenderness might be eased if you:  Cut  down on caffeine.  Reduce the amount of fat in your diet. Also, learn how to do breast examinations at home. This will help you tell when you have an unusual growth or lump that could cause tenderness. And keep a log of the days and times when your breasts are most tender. This will help you and your caregiver find the right solution. SEEK MEDICAL CARE IF:   Any part of your breast is hard, red and hot to the touch. This could be a sign of infection.  Fluid is coming out of your nipples (and you are not breastfeeding). Especially watch for blood or pus.  You have a fever as well as breast tenderness.  You have a new or painful lump in your breast that remains after your period ends.  You have tried to take care of the pain at home, but it has not gone away.  Your breast pain is getting worse. Or, the pain is making it hard to do the things you usually do during your day. Document Released: 08/05/2008 Document Revised: 11/15/2011 Document Reviewed: 08/05/2008 [employer] Patient Information 2013 [employer].  

## 2012-06-20 NOTE — Telephone Encounter (Signed)
Order placed

## 2012-06-20 NOTE — Telephone Encounter (Signed)
Message copied by Aura Camps on Tue Jun 20, 2012  4:00 PM ------      Message from: [name]      Created: Tue Jun 20, 2012 12:33 PM       Victorino Dike, please schedule 3D mammogram of left breast on this patient who has breast tenderness 3 finger breast from the areolar region up to her axillary region. Patient with past history of breast cancer on the contralateral breast please see encounter note for details from today's visit.

## 2012-06-21 NOTE — Telephone Encounter (Signed)
Appointment 06/22/12 @ 10:40 am

## 2012-06-22 ENCOUNTER — Ambulatory Visit
Admission: RE | Admit: 2012-06-22 | Discharge: 2012-06-22 | Disposition: A | Discharge status: Home / Self Care | Payer: BC Managed Care – PPO | Source: Ambulatory Visit | Attending: Gynecology | Admitting: Gynecology

## 2012-06-22 ENCOUNTER — Ambulatory Visit: Payer: BC Managed Care – PPO | Attending: Neurology | Admitting: Physical Therapy

## 2012-06-22 DIAGNOSIS — R5381 Other malaise: Secondary | ICD-10-CM | POA: Insufficient documentation

## 2012-06-22 DIAGNOSIS — IMO0001 Reserved for inherently not codable concepts without codable children: Secondary | ICD-10-CM | POA: Insufficient documentation

## 2012-06-22 DIAGNOSIS — M25569 Pain in unspecified knee: Secondary | ICD-10-CM | POA: Insufficient documentation

## 2012-06-22 DIAGNOSIS — M25549 Pain in joints of unspecified hand: Secondary | ICD-10-CM | POA: Insufficient documentation

## 2012-06-22 DIAGNOSIS — N644 Mastodynia: Secondary | ICD-10-CM

## 2012-06-23 ENCOUNTER — Ambulatory Visit: Payer: BC Managed Care – PPO | Admitting: Physical Therapy

## 2012-06-26 ENCOUNTER — Ambulatory Visit: Payer: BC Managed Care – PPO

## 2012-06-27 ENCOUNTER — Ambulatory Visit: Payer: BC Managed Care – PPO | Admitting: Physical Therapy

## 2013-10-30 ENCOUNTER — Telehealth: Payer: Self-pay | Admitting: *Deleted

## 2013-10-30 NOTE — Telephone Encounter (Signed)
Please tell her that this is the only medication that will  help prevent metastatic breast cancer and osteopenia from progressing to osteoporosis at the same time which would benefit her tremendously because of her past  history of breast cancer.

## 2013-10-30 NOTE — Telephone Encounter (Signed)
Pt informed with the below note. 

## 2013-10-30 NOTE — Telephone Encounter (Signed)
Pt would like to know a similar medication similar to Evista, pt insurance is no longer paying for this medication. Pt now lives in [place]. She is not asking you to prescribe just would like to know the name of medication. Please advise

## 2013-12-31 ENCOUNTER — Telehealth: Payer: Self-pay | Admitting: *Deleted

## 2013-12-31 NOTE — Telephone Encounter (Signed)
This

## 2013-12-31 NOTE — Telephone Encounter (Signed)
This RN returned call to [facility] at Dr Judieth Keens in [place] per message stating " need more information"  Return call number [phone].  Call returned with bad connection and line d/ced on it's own.  Call then returned again with same scenerio.  This RN will attempt to locate other information on above for appropriate follow up and records needed.

## 2014-07-08 ENCOUNTER — Encounter: Payer: Self-pay | Admitting: Gynecology

## 2014-11-25 ENCOUNTER — Encounter: Payer: Self-pay | Admitting: Internal Medicine

## 2015-03-18 ENCOUNTER — Encounter: Payer: Self-pay | Admitting: Genetic Counselor

## 2016-05-26 ENCOUNTER — Other Ambulatory Visit: Payer: Self-pay | Admitting: Internal Medicine

## 2016-06-14 ENCOUNTER — Other Ambulatory Visit: Payer: Self-pay | Admitting: Internal Medicine

## 2016-06-14 DIAGNOSIS — N644 Mastodynia: Secondary | ICD-10-CM

## 2016-06-17 ENCOUNTER — Ambulatory Visit
Admission: RE | Admit: 2016-06-17 | Discharge: 2016-06-17 | Disposition: A | Discharge status: Home / Self Care | Payer: Medicare Other | Source: Ambulatory Visit | Attending: Internal Medicine | Admitting: Internal Medicine

## 2016-06-17 ENCOUNTER — Other Ambulatory Visit: Payer: Self-pay | Admitting: Internal Medicine

## 2016-06-17 DIAGNOSIS — N644 Mastodynia: Secondary | ICD-10-CM

## 2016-06-30 ENCOUNTER — Telehealth: Payer: Self-pay | Admitting: *Deleted

## 2016-06-30 NOTE — Telephone Encounter (Signed)
This RN was contacted by HIM stating pt was directed to them with note from [facility] requesting an appointment.  Pt has medical records with her and is in the building and was crying per requesting an appointment.  This RN spoke with Denise Edwards who was an established pt of Dr Jannifer Rodney- with history of breast cancer.  Per GP request is for follow up per concerns with ongoing discomfort in breast. Pt had mammogram with negative work up.  This RN spoke with Denise Edwards and verified above is primary concern.  Reassured pt Dr Jannifer Rodney would see her to address concerns and then she may benefit from our survivor program.  Informed her appointment will be requested for next available which may be several weeks.  Denise Edwards was very appreciative of above -  Scheduling request sent via IN Box.

## 2016-07-06 ENCOUNTER — Telehealth: Payer: Self-pay | Admitting: Oncology

## 2016-07-06 NOTE — Telephone Encounter (Signed)
Tc to schedule pt an appt. Tried contacting her on 10/26, 10/31 with no success. Unable to lv a vm. Will try to call later.

## 2016-07-26 ENCOUNTER — Ambulatory Visit: Payer: Medicare Other | Admitting: Oncology

## 2016-08-16 ENCOUNTER — Other Ambulatory Visit: Payer: Self-pay | Admitting: Oncology

## 2016-08-17 ENCOUNTER — Encounter: Payer: Self-pay | Admitting: Oncology

## 2016-11-03 ENCOUNTER — Other Ambulatory Visit: Payer: Self-pay

## 2016-11-03 DIAGNOSIS — Z853 Personal history of malignant neoplasm of breast: Secondary | ICD-10-CM

## 2016-11-04 ENCOUNTER — Ambulatory Visit (HOSPITAL_BASED_OUTPATIENT_CLINIC_OR_DEPARTMENT_OTHER): Payer: Medicare Other | Admitting: Oncology

## 2016-11-04 ENCOUNTER — Telehealth: Payer: Self-pay | Admitting: Oncology

## 2016-11-04 ENCOUNTER — Other Ambulatory Visit (HOSPITAL_BASED_OUTPATIENT_CLINIC_OR_DEPARTMENT_OTHER): Payer: Medicare Other

## 2016-11-04 DIAGNOSIS — Z853 Personal history of malignant neoplasm of breast: Secondary | ICD-10-CM

## 2016-11-04 DIAGNOSIS — E039 Hypothyroidism, unspecified: Secondary | ICD-10-CM | POA: Diagnosis not present

## 2016-11-04 DIAGNOSIS — Z170 Estrogen receptor positive status [ER+]: Principal | ICD-10-CM | POA: Insufficient documentation

## 2016-11-04 DIAGNOSIS — C50811 Malignant neoplasm of overlapping sites of right female breast: Secondary | ICD-10-CM

## 2016-11-04 LAB — COMPREHENSIVE METABOLIC PANEL
ALT: 24 U/L (ref 0–55)
AST: 21 U/L (ref 5–34)
Albumin: 4.1 g/dL (ref 3.5–5.0)
Alkaline Phosphatase: 83 U/L (ref 40–150)
Anion Gap: 8 mEq/L (ref 3–11)
BUN: 19 mg/dL (ref 7.0–26.0)
CO2: 28 meq/L (ref 22–29)
Calcium: 9.5 mg/dL (ref 8.4–10.4)
Chloride: 102 mEq/L (ref 98–109)
Creatinine: 0.8 mg/dL (ref 0.6–1.1)
EGFR: 84 mL/min/{1.73_m2} — AB (ref >90)
GLUCOSE: 99 mg/dL (ref 70–140)
POTASSIUM: 4.4 meq/L (ref 3.5–5.1)
SODIUM: 138 meq/L (ref 136–145)
Total Bilirubin: 0.36 mg/dL (ref 0.20–1.20)
Total Protein: 7.6 g/dL (ref 6.4–8.3)

## 2016-11-04 LAB — CBC WITH DIFFERENTIAL/PLATELET
BASO%: 0.4 % (ref 0.0–2.0)
BASOS ABS: 0 10*3/uL (ref 0.0–0.1)
EOS ABS: 0.1 10*3/uL (ref 0.0–0.5)
EOS%: 0.9 % (ref 0.0–7.0)
HCT: 38.4 % (ref 34.8–46.6)
HGB: 13.7 g/dL (ref 11.6–15.9)
LYMPH%: 26.5 % (ref 14.0–49.7)
MCH: 28.9 pg (ref 25.1–34.0)
MCHC: 35.7 g/dL (ref 31.5–36.0)
MCV: 81 fL (ref 79.5–101.0)
MONO#: 0.9 10*3/uL (ref 0.1–0.9)
MONO%: 8.9 % (ref 0.0–14.0)
NEUT#: 6.2 10*3/uL (ref 1.5–6.5)
NEUT%: 63.3 % (ref 38.4–76.8)
Platelets: 259 10*3/uL (ref 145–400)
RBC: 4.74 10*6/uL (ref 3.70–5.45)
RDW: 14 % (ref 11.2–14.5)
WBC: 9.9 10*3/uL (ref 3.9–10.3)
lymph#: 2.6 10*3/uL (ref 0.9–3.3)

## 2016-11-04 NOTE — Telephone Encounter (Signed)
sch appt and printed avs for pt. No orders placed re mammo per scheduling msg
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
## 2016-11-05 ENCOUNTER — Encounter: Payer: Self-pay | Admitting: Oncology

## 2016-12-22 ENCOUNTER — Encounter: Payer: Self-pay | Admitting: Genetics

## 2016-12-22 ENCOUNTER — Other Ambulatory Visit (HOSPITAL_BASED_OUTPATIENT_CLINIC_OR_DEPARTMENT_OTHER): Payer: Medicare Other

## 2016-12-22 ENCOUNTER — Ambulatory Visit (HOSPITAL_BASED_OUTPATIENT_CLINIC_OR_DEPARTMENT_OTHER): Payer: Medicare Other | Admitting: Genetics

## 2016-12-22 DIAGNOSIS — E039 Hypothyroidism, unspecified: Secondary | ICD-10-CM | POA: Diagnosis not present

## 2016-12-22 DIAGNOSIS — Z170 Estrogen receptor positive status [ER+]: Principal | ICD-10-CM

## 2016-12-22 DIAGNOSIS — Z7183 Encounter for nonprocreative genetic counseling: Secondary | ICD-10-CM | POA: Diagnosis not present

## 2016-12-22 DIAGNOSIS — C50811 Malignant neoplasm of overlapping sites of right female breast: Secondary | ICD-10-CM

## 2016-12-22 DIAGNOSIS — Z853 Personal history of malignant neoplasm of breast: Secondary | ICD-10-CM | POA: Diagnosis not present

## 2016-12-22 LAB — COMPREHENSIVE METABOLIC PANEL
ALT: 23 U/L (ref 0–55)
ANION GAP: 9 meq/L (ref 3–11)
AST: 20 U/L (ref 5–34)
Albumin: 3.9 g/dL (ref 3.5–5.0)
Alkaline Phosphatase: 65 U/L (ref 40–150)
BUN: 20.1 mg/dL (ref 7.0–26.0)
CALCIUM: 9.6 mg/dL (ref 8.4–10.4)
CO2: 27 meq/L (ref 22–29)
Chloride: 103 mEq/L (ref 98–109)
Creatinine: 0.8 mg/dL (ref 0.6–1.1)
EGFR: 78 mL/min/{1.73_m2} — AB (ref >90)
Glucose: 102 mg/dl (ref 70–140)
Potassium: 4.2 mEq/L (ref 3.5–5.1)
Sodium: 139 mEq/L (ref 136–145)
TOTAL PROTEIN: 7.3 g/dL (ref 6.4–8.3)
Total Bilirubin: 0.3 mg/dL (ref 0.20–1.20)

## 2016-12-22 LAB — CBC WITH DIFFERENTIAL/PLATELET
BASO%: 0.4 % (ref 0.0–2.0)
Basophils Absolute: 0 10*3/uL (ref 0.0–0.1)
EOS ABS: 0.2 10*3/uL (ref 0.0–0.5)
EOS%: 2.1 % (ref 0.0–7.0)
HCT: 36.6 % (ref 34.8–46.6)
HGB: 12.9 g/dL (ref 11.6–15.9)
LYMPH%: 30.9 % (ref 14.0–49.7)
MCH: 28.9 pg (ref 25.1–34.0)
MCHC: 35.2 g/dL (ref 31.5–36.0)
MCV: 81.9 fL (ref 79.5–101.0)
MONO#: 0.7 10*3/uL (ref 0.1–0.9)
MONO%: 8.9 % (ref 0.0–14.0)
NEUT%: 57.7 % (ref 38.4–76.8)
NEUTROS ABS: 4.4 10*3/uL (ref 1.5–6.5)
PLATELETS: 236 10*3/uL (ref 145–400)
RBC: 4.47 10*6/uL (ref 3.70–5.45)
RDW: 14.1 % (ref 11.2–14.5)
WBC: 7.7 10*3/uL (ref 3.9–10.3)
lymph#: 2.4 10*3/uL (ref 0.9–3.3)
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
## 2017-01-04 ENCOUNTER — Emergency Department (HOSPITAL_COMMUNITY)
Admission: EM | Admit: 2017-01-04 | Discharge: 2017-01-04 | Disposition: A | Discharge status: Home / Self Care | Payer: Medicare Other | Attending: Emergency Medicine | Admitting: Emergency Medicine

## 2017-01-04 ENCOUNTER — Encounter (HOSPITAL_COMMUNITY): Payer: Self-pay | Admitting: *Deleted

## 2017-01-04 ENCOUNTER — Emergency Department (HOSPITAL_COMMUNITY): Payer: Medicare Other

## 2017-01-04 ENCOUNTER — Telehealth: Payer: Self-pay | Admitting: Genetics

## 2017-01-04 DIAGNOSIS — R197 Diarrhea, unspecified: Secondary | ICD-10-CM | POA: Insufficient documentation

## 2017-01-04 DIAGNOSIS — Z79899 Other long term (current) drug therapy: Secondary | ICD-10-CM | POA: Diagnosis not present

## 2017-01-04 DIAGNOSIS — Z853 Personal history of malignant neoplasm of breast: Secondary | ICD-10-CM | POA: Insufficient documentation

## 2017-01-04 DIAGNOSIS — E039 Hypothyroidism, unspecified: Secondary | ICD-10-CM | POA: Insufficient documentation

## 2017-01-04 DIAGNOSIS — R112 Nausea with vomiting, unspecified: Secondary | ICD-10-CM

## 2017-01-04 DIAGNOSIS — R1084 Generalized abdominal pain: Secondary | ICD-10-CM | POA: Diagnosis present

## 2017-01-04 LAB — URINALYSIS, ROUTINE W REFLEX MICROSCOPIC
Bilirubin Urine: NEGATIVE
GLUCOSE, UA: NEGATIVE mg/dL
HGB URINE DIPSTICK: NEGATIVE
KETONES UR: NEGATIVE mg/dL
LEUKOCYTES UA: NEGATIVE
Nitrite: NEGATIVE
PROTEIN: NEGATIVE mg/dL
Specific Gravity, Urine: 1.01 (ref 1.005–1.030)
pH: 7 (ref 5.0–8.0)

## 2017-01-04 LAB — COMPREHENSIVE METABOLIC PANEL
ALK PHOS: 61 U/L (ref 38–126)
ALT: 40 U/L (ref 14–54)
AST: 38 U/L (ref 15–41)
Albumin: 4.4 g/dL (ref 3.5–5.0)
Anion gap: 9 (ref 5–15)
BILIRUBIN TOTAL: 0.7 mg/dL (ref 0.3–1.2)
BUN: 21 mg/dL — AB (ref 6–20)
CALCIUM: 9.4 mg/dL (ref 8.9–10.3)
CHLORIDE: 105 mmol/L (ref 101–111)
CO2: 26 mmol/L (ref 22–32)
CREATININE: 0.67 mg/dL (ref 0.44–1.00)
Glucose, Bld: 120 mg/dL — ABNORMAL HIGH (ref 65–99)
Potassium: 4.7 mmol/L (ref 3.5–5.1)
Sodium: 140 mmol/L (ref 135–145)
TOTAL PROTEIN: 7.7 g/dL (ref 6.5–8.1)

## 2017-01-04 LAB — CBC WITH DIFFERENTIAL/PLATELET
BASOS ABS: 0 10*3/uL (ref 0.0–0.1)
Basophils Relative: 0 %
EOS ABS: 0 10*3/uL (ref 0.0–0.7)
EOS PCT: 0 %
HCT: 38 % (ref 36.0–46.0)
Hemoglobin: 13.4 g/dL (ref 12.0–15.0)
LYMPHS PCT: 5 %
Lymphs Abs: 0.6 10*3/uL — ABNORMAL LOW (ref 0.7–4.0)
MCH: 29.1 pg (ref 26.0–34.0)
MCHC: 35.3 g/dL (ref 30.0–36.0)
MCV: 82.4 fL (ref 78.0–100.0)
MONO ABS: 0.6 10*3/uL (ref 0.1–1.0)
Monocytes Relative: 4 %
Neutro Abs: 12.6 10*3/uL — ABNORMAL HIGH (ref 1.7–7.7)
Neutrophils Relative %: 91 %
PLATELETS: 240 10*3/uL (ref 150–400)
RBC: 4.61 MIL/uL (ref 3.87–5.11)
RDW: 13.6 % (ref 11.5–15.5)
WBC: 13.8 10*3/uL — AB (ref 4.0–10.5)

## 2017-01-04 LAB — POC OCCULT BLOOD, ED: FECAL OCCULT BLD: NEGATIVE

## 2017-01-04 LAB — LIPASE, BLOOD: LIPASE: 37 U/L (ref 11–51)

## 2017-01-04 MED ORDER — IOPAMIDOL (ISOVUE-300) INJECTION 61%
INTRAVENOUS | Status: AC
  Administered 2017-01-04: 30 mL via ORAL
  Filled 2017-01-04: qty 30

## 2017-01-04 MED ORDER — ONDANSETRON HCL 4 MG/2ML IJ SOLN
4.0000 mg | Freq: Once | INTRAMUSCULAR | Status: AC
  Administered 2017-01-04: 4 mg via INTRAVENOUS
  Filled 2017-01-04: qty 2

## 2017-01-04 MED ORDER — FAMOTIDINE IN NACL 20-0.9 MG/50ML-% IV SOLN
20.0000 mg | Freq: Once | INTRAVENOUS | Status: AC
  Administered 2017-01-04: 20 mg via INTRAVENOUS
  Filled 2017-01-04: qty 50

## 2017-01-04 MED ORDER — OMEPRAZOLE 20 MG PO CPDR: 20.0000 mg | DELAYED_RELEASE_CAPSULE | Freq: Every day | ORAL | 0 refills | Status: AC

## 2017-01-04 MED ORDER — DICYCLOMINE HCL 20 MG PO TABS: 20.0000 mg | ORAL_TABLET | Freq: Two times a day (BID) | ORAL | 0 refills | Status: AC

## 2017-01-04 MED ORDER — SODIUM CHLORIDE 0.9 % IV BOLUS (SEPSIS)
1000.0000 mL | Freq: Once | INTRAVENOUS | Status: AC
  Administered 2017-01-04: 1000 mL via INTRAVENOUS

## 2017-01-04 MED ORDER — IOPAMIDOL (ISOVUE-300) INJECTION 61%
30.0000 mL | Freq: Once | INTRAVENOUS | Status: AC | PRN
  Administered 2017-01-04: 30 mL via ORAL

## 2017-01-04 MED ORDER — IOPAMIDOL (ISOVUE-300) INJECTION 61%
INTRAVENOUS | Status: AC
  Filled 2017-01-04: qty 100

## 2017-01-04 MED ORDER — METOCLOPRAMIDE HCL 5 MG/ML IJ SOLN
5.0000 mg | Freq: Once | INTRAMUSCULAR | Status: AC
  Administered 2017-01-04: 5 mg via INTRAVENOUS
  Filled 2017-01-04: qty 2

## 2017-01-04 MED ORDER — PANTOPRAZOLE SODIUM 40 MG IV SOLR
40.0000 mg | Freq: Once | INTRAVENOUS | Status: AC
  Administered 2017-01-04: 40 mg via INTRAVENOUS
  Filled 2017-01-04: qty 40

## 2017-01-04 MED ORDER — ONDANSETRON HCL 4 MG PO TABS: 4.0000 mg | ORAL_TABLET | Freq: Four times a day (QID) | ORAL | 0 refills | Status: AC

## 2017-01-04 MED ORDER — GI COCKTAIL ~~LOC~~
30.0000 mL | Freq: Once | ORAL | Status: AC
  Administered 2017-01-04: 30 mL via ORAL
  Filled 2017-01-04: qty 30

## 2017-01-04 MED ORDER — IOPAMIDOL (ISOVUE-300) INJECTION 61%
100.0000 mL | Freq: Once | INTRAVENOUS | Status: AC | PRN
  Administered 2017-01-04: 80 mL via INTRAVENOUS

## 2017-01-04 MED ORDER — MORPHINE SULFATE (PF) 4 MG/ML IV SOLN
4.0000 mg | Freq: Once | INTRAVENOUS | Status: AC
  Administered 2017-01-04: 4 mg via INTRAVENOUS
  Filled 2017-01-04: qty 1

## 2017-01-04 NOTE — ED Notes (Signed)
Pt. Unable to urinate at this time. Will collect urine when pt. Voids. Nurse aware.  

## 2017-01-04 NOTE — ED Triage Notes (Signed)
Patient is alert and oriented x4.  She is being seen for generalized abdominal pain with nausea and vomiting that started this morning at 1am.  Currently patient rates her pain 4 of 10.

## 2017-01-04 NOTE — Discharge Instructions (Signed)
CAT scan showed no abnormalities and her abdomen. Your appendix is normal. This is likely a viral gastritis. Make sure you're taking the omeprazole once a day. Use Zofran for nausea as needed. May use the Bentyl for abdominal cramping. Take Tylenol for pain. Avoid NSAIDs. Have given you gastrointestinal follow-up please call for an appointment. Clear liquid diet for 24 hours and advance as tolerated with bland food. Return to the ED if you develop any fevers, unable to control her vomiting, worsening pain, blood in her stool or for any reason.
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
## 2017-04-28 ENCOUNTER — Ambulatory Visit: Payer: Self-pay | Admitting: Genetics

## 2017-04-28 DIAGNOSIS — Z1379 Encounter for other screening for genetic and chromosomal anomalies: Secondary | ICD-10-CM

## 2017-05-02 ENCOUNTER — Encounter: Payer: Self-pay | Admitting: Genetics

## 2017-05-02 DIAGNOSIS — Z1379 Encounter for other screening for genetic and chromosomal anomalies: Secondary | ICD-10-CM

## 2017-05-02 HISTORY — DX: Encounter for other screening for genetic and chromosomal anomalies: Z13.79

## 2017-05-02 NOTE — Telephone Encounter (Signed)
With the assistance of a telephone interpreter, reviewed that germline genetic testing revealed no pathogenic mutations. This is considered to be a negative result. Testing was performed through [facility]'s 46-gene Common Hereditary Cancers Panel. [facility] Common Hereditary Cancers Panel includes analysis of the following 46 genes: APC, ATM, AXIN2, BARD1, BMPR1A, BRCA1, BRCA2, BRIP1, CDH1, CDKN2A, CHEK2, CTNNA1, DICER1, EPCAM, GREM1, HOXB13, KIT, MEN1, MLH1, MSH2, MSH3, MSH6, MUTYH, NBN, NF1, NTHL1, PALB2, PDGFRA, PMS2, POLD1, POLE, PTEN, RAD50, RAD51C, RAD51D, SDHA, SDHB, SDHC, SDHD, SMAD4, SMARCA4, STK11, TP53, TSC1, TSC2, and VHL.  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 Discussed that this VUS should not change clinical management.  Per her request, genetic counseling notes and genetic testing results will be mailed to Denise Edwards's address on file.  For more detailed discussion, please see genetic counseling documentation from 04/28/2017. Result report dated 01/01/2017.

## 2017-05-02 NOTE — Progress Notes (Signed)
HPI: Denise Edwards was previously seen in the [facility] on 12/22/2016 due to a personal and family history of cancer and concerns regarding a hereditary predisposition to cancer. Please refer to our prior cancer genetics clinic note for more information regarding Denise Edwards's medical, social and family histories, and our assessment and recommendations, at the time. Denise Edwards recent genetic test results were disclosed to her, as were recommendations warranted by these results. These results and recommendations are discussed in more detail below.  CANCER HISTORY: In 82, at the age of 60, Denise Edwards was diagnosed with cancer of the right breast. This was treated with right modified radical mastectomy, chemotherapy, radiation, and Tamoxifen x5years. Her health history is also significant for left salpingo-oophorectomy due to an ectopic pregnancy and later, a right salpingo-oophorectomy on 11/24/2006.    FAMILY HISTORY:  We obtained a detailed, 4-generation family history.  Significant diagnoses are listed below: Family History  Problem Relation Age of Onset  . Breast cancer Mother 31       d.78  . Breast cancer Sister 57  . Thyroid cancer Maternal Aunt 70  . Breast cancer Sister 79   Denise Edwards has one son, age 32, who is without cancers. Denise Edwards has one brother, age 56, and three sisters, ages 16, 72, and 41. The youngest two sisters both had breast cancer in their 18s. Denise Edwards mother was diagnosed with breast cancer in her early-70s and died at 6. Her mother had two brothers and two sisters. One sister was diagnosed with thyroid cancer in her 56s and the only living sibling of the group. Denise Edwards father was 76 at the time of his death, but no other information is available regarding his health or family history.  Denise Edwards is unaware of previous family history of genetic testing for hereditary cancer risks. Patient's maternal ancestors are of [place] descent, and paternal  ancestors are of [place] descent. There is no reported Ashkenazi Jewish ancestry. There is no known consanguinity.  GENETIC TEST RESULTS: Genetic testing performed through [facility]'s Common Hereditary Cancers Panel reported out on 01/01/2017 showed no pathogenic mutations. [facility] Common Hereditary Cancers Panel includes analysis of the following 46 genes: APC, ATM, AXIN2, BARD1, BMPR1A, BRCA1, BRCA2, BRIP1, CDH1, CDKN2A, CHEK2, CTNNA1, DICER1, EPCAM, GREM1, HOXB13, KIT, MEN1, MLH1, MSH2, MSH3, MSH6, MUTYH, NBN, NF1, NTHL1, PALB2, PDGFRA, PMS2, POLD1, POLE, PTEN, RAD50, RAD51C, RAD51D, SDHA, SDHB, SDHC, SDHD, SMAD4, SMARCA4, STK11, TP53, TSC1, TSC2, and VHL.  A variant of uncertain significance (VUS) called BRIP1 c.1871C>T (p.Ser624Leu) was also noted. At this time, it is unknown if this variant is associated with increased cancer risk or if this is a normal finding, but most variants such as this get reclassified to being inconsequential. It should not be used to make medical management decisions. With time, we suspect the lab will determine the significance of this variant, if any. If we do learn more about it, we will try to contact Denise Edwards to discuss it further. However, it is important to stay in touch with [place] periodically and keep the address and phone number up to date.  The test report will be scanned into [employer] and will be located under the Molecular Pathology section of the Results Review tab.A portion of the result report is included below for reference.    We discussed with Denise Edwards that since the current genetic testing is not perfect, it is possible there may be a gene mutation in one of these  genes that current testing cannot detect, but that chance is small. We also discussed, that it is possible that another gene that has not yet been discovered, or that we have not yet tested, is responsible for the cancer diagnoses in the family. Therefore, important to remain in touch with  cancer genetics in the future so that we can continue to offer Denise Edwards the most up to date genetic testing.   CANCER SCREENING RECOMMENDATIONS: This result indicates that it is unlikely Denise Edwards has an increased risk for a future cancer due to a mutation in one of these genes. The cause of the clustering of breast cancers observed in Denise Edwards's family remains unexplained. However, because no causative or actionable mutations were identified through Denise Edwards genetic testing, it is recommended she continue to follow the cancer management and screening guidelines provided by her oncology and primary healthcare providers.   RECOMMENDATIONS FOR FAMILY MEMBERS: Women in this family might be at some increased risk of developing breast cancer, over the general population risk, simply due to the family history of cancer. We recommended women in this family have a yearly mammogram beginning at age 44, or 54 years younger than the earliest onset of cancer, an annual clinical breast exam, and perform monthly breast self-exams. They should also consult their physicians regarding whether high risk screening is warranted. Women in this family should also have a gynecological exam as recommended by their primary provider. All family members should have a colonoscopy by age 91.  Based on Ms. Brumbaugh's family history, we recommended her sisters who have personal histories of breast cancer have genetic counseling and testing. Ms. Dingley will let us know if we can be of any assistance in coordinating genetic counseling and/or testing for these family members.   FOLLOW-UP: Lastly, we discussed with Ms. Scerbo that cancer genetics is a rapidly advancing field and it is possible that new genetic tests will be appropriate for her and/or her family members in the future. We encouraged her to remain in contact with cancer genetics on an annual basis so we can update her personal and family histories and let her know of advances in  cancer genetics that may benefit this family.   Our contact number was provided. Ms. Sigman questions were answered to her satisfaction, and she knows she is welcome to call us at anytime with additional questions or concerns. Per her request, a copy of her genetic counseling notes and genetic testing results will be mailed to her address on file.  Mal Misty, MS, [facility] Certified Naval [email or web address]

## 2017-07-11 ENCOUNTER — Other Ambulatory Visit: Payer: Self-pay | Admitting: Oncology

## 2017-07-11 DIAGNOSIS — Z1231 Encounter for screening mammogram for malignant neoplasm of breast: Secondary | ICD-10-CM

## 2017-07-12 ENCOUNTER — Other Ambulatory Visit: Payer: Self-pay | Admitting: Internal Medicine

## 2017-07-12 DIAGNOSIS — Z9011 Acquired absence of right breast and nipple: Secondary | ICD-10-CM

## 2017-07-12 DIAGNOSIS — N644 Mastodynia: Secondary | ICD-10-CM

## 2017-07-15 ENCOUNTER — Ambulatory Visit
Admission: RE | Admit: 2017-07-15 | Discharge: 2017-07-15 | Disposition: A | Discharge status: Home / Self Care | Payer: Medicare Other | Source: Ambulatory Visit | Attending: Internal Medicine | Admitting: Internal Medicine

## 2017-07-15 ENCOUNTER — Ambulatory Visit: Admission: RE | Admit: 2017-07-15 | Payer: Medicare Other | Source: Ambulatory Visit

## 2017-07-15 DIAGNOSIS — Z9011 Acquired absence of right breast and nipple: Secondary | ICD-10-CM

## 2017-07-15 DIAGNOSIS — N644 Mastodynia: Secondary | ICD-10-CM

## 2017-10-13 ENCOUNTER — Telehealth: Payer: Self-pay | Admitting: Oncology

## 2017-10-13 NOTE — Telephone Encounter (Signed)
Patient called to cancel °

## 2017-11-07 ENCOUNTER — Other Ambulatory Visit: Payer: Medicare Other

## 2017-11-07 ENCOUNTER — Ambulatory Visit: Payer: Medicare Other | Admitting: Oncology

## 2018-06-20 ENCOUNTER — Other Ambulatory Visit: Payer: Self-pay | Admitting: Internal Medicine

## 2018-06-20 DIAGNOSIS — Z1231 Encounter for screening mammogram for malignant neoplasm of breast: Secondary | ICD-10-CM

## 2018-07-24 ENCOUNTER — Ambulatory Visit
Admission: RE | Admit: 2018-07-24 | Discharge: 2018-07-24 | Disposition: A | Discharge status: Home / Self Care | Payer: Medicare Other | Source: Ambulatory Visit | Attending: Internal Medicine | Admitting: Internal Medicine

## 2018-07-24 DIAGNOSIS — Z1231 Encounter for screening mammogram for malignant neoplasm of breast: Secondary | ICD-10-CM

## 2019-08-08 ENCOUNTER — Other Ambulatory Visit: Payer: Self-pay | Admitting: Internal Medicine

## 2019-08-08 DIAGNOSIS — Z1231 Encounter for screening mammogram for malignant neoplasm of breast: Secondary | ICD-10-CM

## 2019-09-10 ENCOUNTER — Ambulatory Visit
Admission: RE | Admit: 2019-09-10 | Discharge: 2019-09-10 | Disposition: A | Discharge status: Home / Self Care | Payer: Medicare Other | Source: Ambulatory Visit | Attending: Internal Medicine | Admitting: Internal Medicine

## 2019-09-10 ENCOUNTER — Other Ambulatory Visit: Payer: Self-pay

## 2019-09-10 DIAGNOSIS — Z1231 Encounter for screening mammogram for malignant neoplasm of breast: Secondary | ICD-10-CM

## 2019-09-27 ENCOUNTER — Ambulatory Visit: Payer: Medicare Other

## 2020-10-20 ENCOUNTER — Other Ambulatory Visit: Payer: Self-pay | Admitting: Internal Medicine

## 2020-10-20 DIAGNOSIS — Z1231 Encounter for screening mammogram for malignant neoplasm of breast: Secondary | ICD-10-CM

## 2020-10-22 ENCOUNTER — Other Ambulatory Visit: Payer: Self-pay

## 2020-10-22 ENCOUNTER — Ambulatory Visit
Admission: RE | Admit: 2020-10-22 | Discharge: 2020-10-22 | Disposition: A | Discharge status: Home / Self Care | Payer: Medicare Other | Source: Ambulatory Visit | Attending: Internal Medicine | Admitting: Internal Medicine

## 2020-10-22 DIAGNOSIS — Z1231 Encounter for screening mammogram for malignant neoplasm of breast: Secondary | ICD-10-CM

## 2021-03-22 ENCOUNTER — Other Ambulatory Visit: Payer: Self-pay

## 2021-03-22 ENCOUNTER — Emergency Department (HOSPITAL_COMMUNITY): Payer: Medicare Other

## 2021-03-22 ENCOUNTER — Emergency Department (HOSPITAL_COMMUNITY)
Admission: EM | Admit: 2021-03-22 | Discharge: 2021-03-22 | Disposition: A | Discharge status: Home / Self Care | Payer: Medicare Other | Attending: Emergency Medicine | Admitting: Emergency Medicine

## 2021-03-22 DIAGNOSIS — W19XXXA Unspecified fall, initial encounter: Secondary | ICD-10-CM

## 2021-03-22 DIAGNOSIS — S0990XA Unspecified injury of head, initial encounter: Secondary | ICD-10-CM | POA: Diagnosis present

## 2021-03-22 DIAGNOSIS — Z9221 Personal history of antineoplastic chemotherapy: Secondary | ICD-10-CM | POA: Insufficient documentation

## 2021-03-22 DIAGNOSIS — E039 Hypothyroidism, unspecified: Secondary | ICD-10-CM | POA: Insufficient documentation

## 2021-03-22 DIAGNOSIS — W108XXA Fall (on) (from) other stairs and steps, initial encounter: Secondary | ICD-10-CM | POA: Insufficient documentation

## 2021-03-22 DIAGNOSIS — Z923 Personal history of irradiation: Secondary | ICD-10-CM | POA: Insufficient documentation

## 2021-03-22 DIAGNOSIS — Y92009 Unspecified place in unspecified non-institutional (private) residence as the place of occurrence of the external cause: Secondary | ICD-10-CM | POA: Insufficient documentation

## 2021-03-22 DIAGNOSIS — R52 Pain, unspecified: Secondary | ICD-10-CM

## 2021-03-22 DIAGNOSIS — Y9301 Activity, walking, marching and hiking: Secondary | ICD-10-CM | POA: Insufficient documentation

## 2021-03-22 DIAGNOSIS — Z853 Personal history of malignant neoplasm of breast: Secondary | ICD-10-CM | POA: Diagnosis not present

## 2021-03-22 DIAGNOSIS — S0083XA Contusion of other part of head, initial encounter: Secondary | ICD-10-CM | POA: Diagnosis not present

## 2021-03-22 DIAGNOSIS — Z79899 Other long term (current) drug therapy: Secondary | ICD-10-CM | POA: Diagnosis not present

## 2021-03-22 DIAGNOSIS — T07XXXA Unspecified multiple injuries, initial encounter: Secondary | ICD-10-CM

## 2021-03-22 MED ORDER — HYDROCODONE-ACETAMINOPHEN 5-325 MG PO TABS
1.0000 | ORAL_TABLET | Freq: Once | ORAL | Status: AC
Start: 2021-03-22 — End: 2021-03-22
  Administered 2021-03-22: 1 via ORAL
  Filled 2021-03-22: qty 1

## 2021-03-22 NOTE — ED Triage Notes (Signed)
Pt presents to ED via ems cc fall. Pt states she fell down 5 stairs at her home. Pt states she hit her head first then RLE. Pt complains of pain to right periorbital region, right hip, and right knee. Pt denies loc, blood thinners. A&Ox4. Respirations equal, unlabored.
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
## 2021-03-22 NOTE — Discharge Instructions (Addendum)
100Use Tylenol 650 mg every 4 hours, or Motrin milligrams every 8 hours with meals, as needed for pain.  Use ice on the sore areas 3 times a day for 2 days after that use heat to help with the discomfort.  Rest is much as needed until you start feeling better.  Check with your primary care doctor if you are having problems.

## 2021-09-24 ENCOUNTER — Other Ambulatory Visit: Payer: Self-pay | Admitting: Internal Medicine

## 2021-09-24 DIAGNOSIS — Z1231 Encounter for screening mammogram for malignant neoplasm of breast: Secondary | ICD-10-CM

## 2021-10-23 ENCOUNTER — Ambulatory Visit: Payer: Medicare Other

## 2021-10-29 ENCOUNTER — Ambulatory Visit
Admission: RE | Admit: 2021-10-29 | Discharge: 2021-10-29 | Disposition: A | Discharge status: Home / Self Care | Payer: Medicare Other | Source: Ambulatory Visit | Attending: Internal Medicine | Admitting: Internal Medicine

## 2021-10-29 DIAGNOSIS — Z1231 Encounter for screening mammogram for malignant neoplasm of breast: Secondary | ICD-10-CM

## 2022-10-06 ENCOUNTER — Other Ambulatory Visit: Payer: Self-pay | Admitting: Internal Medicine

## 2022-10-06 DIAGNOSIS — Z1231 Encounter for screening mammogram for malignant neoplasm of breast: Secondary | ICD-10-CM

## 2022-11-26 ENCOUNTER — Ambulatory Visit
Admission: RE | Admit: 2022-11-26 | Discharge: 2022-11-26 | Disposition: A | Discharge status: Home / Self Care | Payer: Medicare Other | Source: Ambulatory Visit | Attending: Internal Medicine | Admitting: Internal Medicine

## 2022-11-26 DIAGNOSIS — Z1231 Encounter for screening mammogram for malignant neoplasm of breast: Secondary | ICD-10-CM

## 2023-09-12 ENCOUNTER — Other Ambulatory Visit: Payer: Self-pay | Admitting: Internal Medicine

## 2023-09-12 DIAGNOSIS — Z0000 Encounter for general adult medical examination without abnormal findings: Secondary | ICD-10-CM

## 2023-12-01 ENCOUNTER — Ambulatory Visit
Admission: RE | Admit: 2023-12-01 | Discharge: 2023-12-01 | Disposition: A | Discharge status: Home / Self Care | Payer: Medicare Other | Source: Ambulatory Visit | Attending: Internal Medicine | Admitting: Internal Medicine

## 2023-12-01 DIAGNOSIS — Z0000 Encounter for general adult medical examination without abnormal findings: Secondary | ICD-10-CM
